# Patient Record
Sex: Male | Born: 1935 | Race: White | Hispanic: No | Marital: Married | Smoking: Former smoker
Health system: Southern US, Community
[De-identification: ages and names within clinical notes are randomized; demographics above are authoritative.]

## PROBLEM LIST (undated history)

## (undated) ENCOUNTER — Emergency Department (HOSPITAL_COMMUNITY): Discharge status: Absent without leave | Payer: Medicare Other

## (undated) DIAGNOSIS — R319 Hematuria, unspecified: Secondary | ICD-10-CM

## (undated) DIAGNOSIS — R3589 Other polyuria: Secondary | ICD-10-CM

## (undated) DIAGNOSIS — I447 Left bundle-branch block, unspecified: Secondary | ICD-10-CM

## (undated) DIAGNOSIS — R358 Other polyuria: Secondary | ICD-10-CM

## (undated) DIAGNOSIS — F329 Major depressive disorder, single episode, unspecified: Secondary | ICD-10-CM

## (undated) DIAGNOSIS — R9431 Abnormal electrocardiogram [ECG] [EKG]: Secondary | ICD-10-CM

## (undated) DIAGNOSIS — E785 Hyperlipidemia, unspecified: Secondary | ICD-10-CM

## (undated) DIAGNOSIS — F411 Generalized anxiety disorder: Secondary | ICD-10-CM

## (undated) DIAGNOSIS — R001 Bradycardia, unspecified: Secondary | ICD-10-CM

## (undated) DIAGNOSIS — F32A Depression, unspecified: Secondary | ICD-10-CM

## (undated) DIAGNOSIS — F0280 Dementia in other diseases classified elsewhere without behavioral disturbance: Secondary | ICD-10-CM

## (undated) DIAGNOSIS — G309 Alzheimer's disease, unspecified: Secondary | ICD-10-CM

## (undated) DIAGNOSIS — F0390 Unspecified dementia without behavioral disturbance: Secondary | ICD-10-CM

## (undated) DIAGNOSIS — I440 Atrioventricular block, first degree: Secondary | ICD-10-CM

## (undated) HISTORY — DX: Other polyuria: R35.89

## (undated) HISTORY — DX: Other polyuria: R35.8

## (undated) HISTORY — DX: Bradycardia, unspecified: R00.1

## (undated) HISTORY — PX: OTHER SURGICAL HISTORY: SHX169

## (undated) HISTORY — DX: Atrioventricular block, first degree: I44.0

## (undated) HISTORY — DX: Abnormal electrocardiogram (ECG) (EKG): R94.31

## (undated) HISTORY — DX: Dementia in other diseases classified elsewhere, unspecified severity, without behavioral disturbance, psychotic disturbance, mood disturbance, and anxiety: F02.80

## (undated) HISTORY — DX: Hyperlipidemia, unspecified: E78.5

## (undated) HISTORY — DX: Left bundle-branch block, unspecified: I44.7

## (undated) HISTORY — DX: Generalized anxiety disorder: F41.1

## (undated) HISTORY — DX: Hematuria, unspecified: R31.9

## (undated) HISTORY — DX: Alzheimer's disease, unspecified: G30.9

## (undated) HISTORY — DX: Unspecified dementia, unspecified severity, without behavioral disturbance, psychotic disturbance, mood disturbance, and anxiety: F03.90

---

## 2011-03-14 DIAGNOSIS — F0390 Unspecified dementia without behavioral disturbance: Secondary | ICD-10-CM | POA: Diagnosis not present

## 2011-03-14 DIAGNOSIS — I10 Essential (primary) hypertension: Secondary | ICD-10-CM | POA: Diagnosis not present

## 2011-03-14 DIAGNOSIS — G549 Nerve root and plexus disorder, unspecified: Secondary | ICD-10-CM | POA: Diagnosis not present

## 2011-03-14 DIAGNOSIS — E785 Hyperlipidemia, unspecified: Secondary | ICD-10-CM | POA: Diagnosis not present

## 2011-06-11 DIAGNOSIS — I10 Essential (primary) hypertension: Secondary | ICD-10-CM | POA: Diagnosis not present

## 2011-06-11 DIAGNOSIS — F0390 Unspecified dementia without behavioral disturbance: Secondary | ICD-10-CM | POA: Diagnosis not present

## 2011-06-11 DIAGNOSIS — G549 Nerve root and plexus disorder, unspecified: Secondary | ICD-10-CM | POA: Diagnosis not present

## 2011-06-11 DIAGNOSIS — R413 Other amnesia: Secondary | ICD-10-CM | POA: Diagnosis not present

## 2011-06-25 DIAGNOSIS — I10 Essential (primary) hypertension: Secondary | ICD-10-CM | POA: Diagnosis not present

## 2011-06-25 DIAGNOSIS — R413 Other amnesia: Secondary | ICD-10-CM | POA: Diagnosis not present

## 2011-06-25 DIAGNOSIS — E785 Hyperlipidemia, unspecified: Secondary | ICD-10-CM | POA: Diagnosis not present

## 2011-09-19 DIAGNOSIS — I10 Essential (primary) hypertension: Secondary | ICD-10-CM | POA: Diagnosis not present

## 2011-09-19 DIAGNOSIS — Z0000 Encounter for general adult medical examination without abnormal findings: Secondary | ICD-10-CM | POA: Diagnosis not present

## 2011-09-19 DIAGNOSIS — F0390 Unspecified dementia without behavioral disturbance: Secondary | ICD-10-CM | POA: Diagnosis not present

## 2011-09-19 DIAGNOSIS — Z23 Encounter for immunization: Secondary | ICD-10-CM | POA: Diagnosis not present

## 2011-09-19 DIAGNOSIS — R413 Other amnesia: Secondary | ICD-10-CM | POA: Diagnosis not present

## 2011-09-23 DIAGNOSIS — R413 Other amnesia: Secondary | ICD-10-CM | POA: Diagnosis not present

## 2011-09-23 DIAGNOSIS — I10 Essential (primary) hypertension: Secondary | ICD-10-CM | POA: Diagnosis not present

## 2011-09-23 DIAGNOSIS — M1990 Unspecified osteoarthritis, unspecified site: Secondary | ICD-10-CM | POA: Diagnosis not present

## 2011-12-19 DIAGNOSIS — E785 Hyperlipidemia, unspecified: Secondary | ICD-10-CM | POA: Diagnosis not present

## 2011-12-19 DIAGNOSIS — I10 Essential (primary) hypertension: Secondary | ICD-10-CM | POA: Diagnosis not present

## 2012-03-16 DIAGNOSIS — I6529 Occlusion and stenosis of unspecified carotid artery: Secondary | ICD-10-CM | POA: Diagnosis not present

## 2012-10-30 DIAGNOSIS — Z23 Encounter for immunization: Secondary | ICD-10-CM | POA: Diagnosis not present

## 2012-11-30 DIAGNOSIS — F0390 Unspecified dementia without behavioral disturbance: Secondary | ICD-10-CM | POA: Diagnosis not present

## 2012-11-30 DIAGNOSIS — Z7189 Other specified counseling: Secondary | ICD-10-CM | POA: Diagnosis not present

## 2012-11-30 DIAGNOSIS — N400 Enlarged prostate without lower urinary tract symptoms: Secondary | ICD-10-CM | POA: Diagnosis not present

## 2012-11-30 DIAGNOSIS — E785 Hyperlipidemia, unspecified: Secondary | ICD-10-CM | POA: Diagnosis not present

## 2013-07-01 DIAGNOSIS — B359 Dermatophytosis, unspecified: Secondary | ICD-10-CM | POA: Diagnosis not present

## 2013-09-21 DIAGNOSIS — E785 Hyperlipidemia, unspecified: Secondary | ICD-10-CM | POA: Diagnosis not present

## 2013-09-21 DIAGNOSIS — Z0000 Encounter for general adult medical examination without abnormal findings: Secondary | ICD-10-CM | POA: Diagnosis not present

## 2013-09-21 DIAGNOSIS — F329 Major depressive disorder, single episode, unspecified: Secondary | ICD-10-CM | POA: Diagnosis not present

## 2013-09-21 DIAGNOSIS — Z23 Encounter for immunization: Secondary | ICD-10-CM | POA: Diagnosis not present

## 2013-09-21 DIAGNOSIS — Z1331 Encounter for screening for depression: Secondary | ICD-10-CM | POA: Diagnosis not present

## 2013-09-21 DIAGNOSIS — F0390 Unspecified dementia without behavioral disturbance: Secondary | ICD-10-CM | POA: Diagnosis not present

## 2013-09-21 DIAGNOSIS — F3289 Other specified depressive episodes: Secondary | ICD-10-CM | POA: Diagnosis not present

## 2014-03-31 DIAGNOSIS — R319 Hematuria, unspecified: Secondary | ICD-10-CM | POA: Diagnosis not present

## 2014-03-31 DIAGNOSIS — Z7901 Long term (current) use of anticoagulants: Secondary | ICD-10-CM | POA: Diagnosis not present

## 2014-04-07 DIAGNOSIS — F0390 Unspecified dementia without behavioral disturbance: Secondary | ICD-10-CM | POA: Diagnosis not present

## 2014-05-18 DIAGNOSIS — R319 Hematuria, unspecified: Secondary | ICD-10-CM | POA: Diagnosis not present

## 2014-05-18 DIAGNOSIS — F411 Generalized anxiety disorder: Secondary | ICD-10-CM | POA: Diagnosis not present

## 2014-05-18 DIAGNOSIS — E785 Hyperlipidemia, unspecified: Secondary | ICD-10-CM | POA: Diagnosis not present

## 2014-05-18 DIAGNOSIS — F0390 Unspecified dementia without behavioral disturbance: Secondary | ICD-10-CM | POA: Diagnosis not present

## 2014-06-08 DIAGNOSIS — F0390 Unspecified dementia without behavioral disturbance: Secondary | ICD-10-CM | POA: Diagnosis not present

## 2014-06-08 DIAGNOSIS — E785 Hyperlipidemia, unspecified: Secondary | ICD-10-CM | POA: Diagnosis not present

## 2014-06-08 DIAGNOSIS — R319 Hematuria, unspecified: Secondary | ICD-10-CM | POA: Diagnosis not present

## 2014-06-08 DIAGNOSIS — F411 Generalized anxiety disorder: Secondary | ICD-10-CM | POA: Diagnosis not present

## 2014-06-08 DIAGNOSIS — E782 Mixed hyperlipidemia: Secondary | ICD-10-CM | POA: Diagnosis not present

## 2014-10-14 DIAGNOSIS — F0390 Unspecified dementia without behavioral disturbance: Secondary | ICD-10-CM | POA: Diagnosis not present

## 2014-10-14 DIAGNOSIS — Z23 Encounter for immunization: Secondary | ICD-10-CM | POA: Diagnosis not present

## 2014-10-14 DIAGNOSIS — E785 Hyperlipidemia, unspecified: Secondary | ICD-10-CM | POA: Diagnosis not present

## 2014-10-14 DIAGNOSIS — E782 Mixed hyperlipidemia: Secondary | ICD-10-CM | POA: Diagnosis not present

## 2014-10-14 DIAGNOSIS — F411 Generalized anxiety disorder: Secondary | ICD-10-CM | POA: Diagnosis not present

## 2015-01-27 DIAGNOSIS — F0390 Unspecified dementia without behavioral disturbance: Secondary | ICD-10-CM | POA: Diagnosis not present

## 2015-01-27 DIAGNOSIS — N400 Enlarged prostate without lower urinary tract symptoms: Secondary | ICD-10-CM | POA: Diagnosis not present

## 2015-01-27 DIAGNOSIS — F411 Generalized anxiety disorder: Secondary | ICD-10-CM | POA: Diagnosis not present

## 2015-01-27 DIAGNOSIS — E782 Mixed hyperlipidemia: Secondary | ICD-10-CM | POA: Diagnosis not present

## 2015-01-27 DIAGNOSIS — N390 Urinary tract infection, site not specified: Secondary | ICD-10-CM | POA: Diagnosis not present

## 2015-06-02 DIAGNOSIS — D649 Anemia, unspecified: Secondary | ICD-10-CM | POA: Diagnosis not present

## 2015-06-02 DIAGNOSIS — E785 Hyperlipidemia, unspecified: Secondary | ICD-10-CM | POA: Diagnosis not present

## 2015-06-02 DIAGNOSIS — R5383 Other fatigue: Secondary | ICD-10-CM | POA: Diagnosis not present

## 2015-06-02 DIAGNOSIS — R319 Hematuria, unspecified: Secondary | ICD-10-CM | POA: Diagnosis not present

## 2015-06-02 DIAGNOSIS — Z79899 Other long term (current) drug therapy: Secondary | ICD-10-CM | POA: Diagnosis not present

## 2015-06-06 DIAGNOSIS — R358 Other polyuria: Secondary | ICD-10-CM | POA: Diagnosis not present

## 2015-06-06 DIAGNOSIS — E782 Mixed hyperlipidemia: Secondary | ICD-10-CM | POA: Diagnosis not present

## 2015-06-06 DIAGNOSIS — F0390 Unspecified dementia without behavioral disturbance: Secondary | ICD-10-CM | POA: Diagnosis not present

## 2015-06-06 DIAGNOSIS — R5383 Other fatigue: Secondary | ICD-10-CM | POA: Diagnosis not present

## 2015-06-06 DIAGNOSIS — F411 Generalized anxiety disorder: Secondary | ICD-10-CM | POA: Diagnosis not present

## 2015-07-14 DIAGNOSIS — G309 Alzheimer's disease, unspecified: Secondary | ICD-10-CM | POA: Diagnosis not present

## 2015-07-14 DIAGNOSIS — F0280 Dementia in other diseases classified elsewhere without behavioral disturbance: Secondary | ICD-10-CM | POA: Diagnosis not present

## 2015-07-14 DIAGNOSIS — E782 Mixed hyperlipidemia: Secondary | ICD-10-CM | POA: Diagnosis not present

## 2015-07-14 DIAGNOSIS — F419 Anxiety disorder, unspecified: Secondary | ICD-10-CM | POA: Diagnosis not present

## 2015-07-19 DIAGNOSIS — F0280 Dementia in other diseases classified elsewhere without behavioral disturbance: Secondary | ICD-10-CM | POA: Diagnosis not present

## 2015-07-19 DIAGNOSIS — F419 Anxiety disorder, unspecified: Secondary | ICD-10-CM | POA: Diagnosis not present

## 2015-07-19 DIAGNOSIS — E782 Mixed hyperlipidemia: Secondary | ICD-10-CM | POA: Diagnosis not present

## 2015-07-19 DIAGNOSIS — G309 Alzheimer's disease, unspecified: Secondary | ICD-10-CM | POA: Diagnosis not present

## 2015-07-20 DIAGNOSIS — E782 Mixed hyperlipidemia: Secondary | ICD-10-CM | POA: Diagnosis not present

## 2015-07-20 DIAGNOSIS — F0280 Dementia in other diseases classified elsewhere without behavioral disturbance: Secondary | ICD-10-CM | POA: Diagnosis not present

## 2015-07-20 DIAGNOSIS — F419 Anxiety disorder, unspecified: Secondary | ICD-10-CM | POA: Diagnosis not present

## 2015-07-20 DIAGNOSIS — G309 Alzheimer's disease, unspecified: Secondary | ICD-10-CM | POA: Diagnosis not present

## 2015-07-25 DIAGNOSIS — F419 Anxiety disorder, unspecified: Secondary | ICD-10-CM | POA: Diagnosis not present

## 2015-07-25 DIAGNOSIS — G309 Alzheimer's disease, unspecified: Secondary | ICD-10-CM | POA: Diagnosis not present

## 2015-07-25 DIAGNOSIS — F0280 Dementia in other diseases classified elsewhere without behavioral disturbance: Secondary | ICD-10-CM | POA: Diagnosis not present

## 2015-07-25 DIAGNOSIS — E782 Mixed hyperlipidemia: Secondary | ICD-10-CM | POA: Diagnosis not present

## 2015-07-26 ENCOUNTER — Telehealth: Payer: Self-pay | Admitting: Cardiology

## 2015-07-26 DIAGNOSIS — G309 Alzheimer's disease, unspecified: Secondary | ICD-10-CM | POA: Diagnosis not present

## 2015-07-26 DIAGNOSIS — E782 Mixed hyperlipidemia: Secondary | ICD-10-CM | POA: Diagnosis not present

## 2015-07-26 DIAGNOSIS — R9431 Abnormal electrocardiogram [ECG] [EKG]: Secondary | ICD-10-CM | POA: Diagnosis not present

## 2015-07-26 DIAGNOSIS — R001 Bradycardia, unspecified: Secondary | ICD-10-CM | POA: Diagnosis not present

## 2015-07-26 DIAGNOSIS — F0280 Dementia in other diseases classified elsewhere without behavioral disturbance: Secondary | ICD-10-CM | POA: Diagnosis not present

## 2015-07-26 DIAGNOSIS — I447 Left bundle-branch block, unspecified: Secondary | ICD-10-CM | POA: Diagnosis not present

## 2015-07-26 DIAGNOSIS — F419 Anxiety disorder, unspecified: Secondary | ICD-10-CM | POA: Diagnosis not present

## 2015-07-27 DIAGNOSIS — E782 Mixed hyperlipidemia: Secondary | ICD-10-CM | POA: Diagnosis not present

## 2015-07-27 DIAGNOSIS — G309 Alzheimer's disease, unspecified: Secondary | ICD-10-CM | POA: Diagnosis not present

## 2015-07-27 DIAGNOSIS — F419 Anxiety disorder, unspecified: Secondary | ICD-10-CM | POA: Diagnosis not present

## 2015-07-27 DIAGNOSIS — F0280 Dementia in other diseases classified elsewhere without behavioral disturbance: Secondary | ICD-10-CM | POA: Diagnosis not present

## 2015-07-27 NOTE — Telephone Encounter (Signed)
Closed encounter °

## 2015-07-28 DIAGNOSIS — E782 Mixed hyperlipidemia: Secondary | ICD-10-CM | POA: Diagnosis not present

## 2015-07-28 DIAGNOSIS — G309 Alzheimer's disease, unspecified: Secondary | ICD-10-CM | POA: Diagnosis not present

## 2015-07-28 DIAGNOSIS — F0280 Dementia in other diseases classified elsewhere without behavioral disturbance: Secondary | ICD-10-CM | POA: Diagnosis not present

## 2015-07-28 DIAGNOSIS — F419 Anxiety disorder, unspecified: Secondary | ICD-10-CM | POA: Diagnosis not present

## 2015-08-01 DIAGNOSIS — F419 Anxiety disorder, unspecified: Secondary | ICD-10-CM | POA: Diagnosis not present

## 2015-08-01 DIAGNOSIS — G309 Alzheimer's disease, unspecified: Secondary | ICD-10-CM | POA: Diagnosis not present

## 2015-08-01 DIAGNOSIS — E782 Mixed hyperlipidemia: Secondary | ICD-10-CM | POA: Diagnosis not present

## 2015-08-01 DIAGNOSIS — F0280 Dementia in other diseases classified elsewhere without behavioral disturbance: Secondary | ICD-10-CM | POA: Diagnosis not present

## 2015-08-02 ENCOUNTER — Encounter: Payer: Self-pay | Admitting: Physician Assistant

## 2015-08-02 DIAGNOSIS — F419 Anxiety disorder, unspecified: Secondary | ICD-10-CM | POA: Diagnosis not present

## 2015-08-02 DIAGNOSIS — G309 Alzheimer's disease, unspecified: Secondary | ICD-10-CM | POA: Diagnosis not present

## 2015-08-02 DIAGNOSIS — F0280 Dementia in other diseases classified elsewhere without behavioral disturbance: Secondary | ICD-10-CM | POA: Diagnosis not present

## 2015-08-02 DIAGNOSIS — E782 Mixed hyperlipidemia: Secondary | ICD-10-CM | POA: Diagnosis not present

## 2015-08-03 ENCOUNTER — Encounter: Payer: Self-pay | Admitting: Physician Assistant

## 2015-08-03 ENCOUNTER — Ambulatory Visit (INDEPENDENT_AMBULATORY_CARE_PROVIDER_SITE_OTHER): Payer: Medicare Other | Admitting: Physician Assistant

## 2015-08-03 VITALS — BP 100/42 | HR 50 | Ht 71.0 in | Wt 184.0 lb

## 2015-08-03 DIAGNOSIS — F0280 Dementia in other diseases classified elsewhere without behavioral disturbance: Secondary | ICD-10-CM

## 2015-08-03 DIAGNOSIS — G309 Alzheimer's disease, unspecified: Secondary | ICD-10-CM | POA: Diagnosis not present

## 2015-08-03 DIAGNOSIS — I447 Left bundle-branch block, unspecified: Secondary | ICD-10-CM | POA: Diagnosis not present

## 2015-08-03 DIAGNOSIS — R001 Bradycardia, unspecified: Secondary | ICD-10-CM | POA: Diagnosis not present

## 2015-08-03 DIAGNOSIS — E785 Hyperlipidemia, unspecified: Secondary | ICD-10-CM

## 2015-08-03 LAB — BASIC METABOLIC PANEL
BUN: 21 mg/dL (ref 7–25)
CALCIUM: 9.1 mg/dL (ref 8.6–10.3)
CO2: 27 mmol/L (ref 20–31)
Chloride: 106 mmol/L (ref 98–110)
Creat: 1 mg/dL (ref 0.70–1.11)
GLUCOSE: 85 mg/dL (ref 65–99)
Potassium: 4.4 mmol/L (ref 3.5–5.3)
SODIUM: 143 mmol/L (ref 135–146)

## 2015-08-03 LAB — MAGNESIUM: Magnesium: 2.4 mg/dL (ref 1.5–2.5)

## 2015-08-03 LAB — TSH: TSH: 2.9 m[IU]/L (ref 0.40–4.50)

## 2015-08-03 NOTE — Patient Instructions (Signed)
Medication Instructions:  Your physician recommends that you continue on your current medications as directed. Please refer to the Current Medication list given to you today.   Labwork: TODAY:  TSH, MAGNESIUM, & BMET  Testing/Procedures: None ordered  Follow-Up: Your physician wants you to follow-up in: 6 MONTHS WITH DR. Katrinka BlazingSMITH.  You will receive a reminder letter in the mail two months in advance. If you don't receive a letter, please call our office to schedule the follow-up appointment.   Any Other Special Instructions Will Be Listed Below (If Applicable).     If you need a refill on your cardiac medications before your next appointment, please call your pharmacy.

## 2015-08-03 NOTE — Progress Notes (Signed)
Cardiology Office Note    Date:  08/03/2015  ID:  Troy Olp Sr., DOB 11-Jul-1922, MRN [id number] PCP:  Maryelizabeth Rowan, MD  Cardiologist:  New to Dr. Katrinka Blazing  Chief Complaint: evaluate bradycardia  History of Present Illness:  Troy Hellberg Sr. is a 82 y.o. male with history of Alzheimer's disease, habitual alcohol intake, generalized anxiety disorder, hyperlipidemia, LBBB, former tobacco abuse who presents for evaluation of bradycardia. Per PCP notes on 07/26/15: "got a call from PT yesterday, hr was 48. Increased to 52 after exercised. Wife rechecked later and was in 17s." The patient's wife was advised to keep a log of the HR which ranged 51-59 with normal BPS 120s-130s. EKG from that visit was poor technical quality, showed HR 48 with irregular rhythm - upon close inspection, there are episodic P waves present - appears possibly NSR with long first degree AVB with occasional PVC and junctional escape.   He presents to clinic today with his wife and son. His wife said his dementia has steadily been worsening. He can't really tell me who is in the room today, where he is or what season it is. He denies all symptoms including dizziness, syncope, CP, SOB. His wife affirms that he hasn't had any cardiac symptoms. He is a little "foggy" mentally in the morning but otherwise is asymptomatic. He has history of chronic alcohol abuse, but has cut down to 1-1.5 drinks daily (beer, wine) over the last two years. He is not very active. He will only walk with PT per his family.   Past Medical History  Diagnosis Date  . Generalized anxiety disorder   . Dementia   . Hyperlipidemia   . Hematuria   . Left bundle branch block   . Abnormal electrocardiogram   . Alzheimer disease   . Polyuria   . Bradycardia   . 1St degree AV block     Past Surgical History  Procedure Laterality Date  . None      Current Medications: Current Outpatient Prescriptions  Medication Sig Dispense Refill  . aspirin EC  81 MG tablet Take 81 mg by mouth daily.    Marland Kitchen escitalopram (LEXAPRO) 20 MG tablet Take 10 mg by mouth daily.    . memantine (NAMENDA) 10 MG tablet Take 10 mg by mouth 2 (two) times daily.    . tamsulosin (FLOMAX) 0.4 MG CAPS capsule Take 0.4 mg by mouth daily.     No current facility-administered medications for this visit.     Allergies:   Review of patient's allergies indicates no known allergies.   Social History   Social History  . Marital Status: Married    Spouse Name: LUETTA  . Number of Children: 8  . Years of Education: 12   Occupational History  . RETIRED    Social History Main Topics  . Smoking status: Former Smoker    Quit date: 05/10/2014  . Smokeless tobacco: None  . Alcohol Use: 0.0 oz/week    0 Standard drinks or equivalent per week     Comment: now 1.5 drinks daily  . Drug Use: No  . Sexual Activity: Not Asked   Other Topics Concern  . None   Social History Narrative     Family History:  The patient's family history includes Dementia in his mother.   ROS:   Please see the history of present illness. All other systems are reviewed and otherwise negative.    PHYSICAL EXAM:   VS:  BP 100/42 mmHg  Pulse  50  Ht 5\' 11"  (1.803 m)  Wt 184 lb (83.462 kg)  BMI 25.67 kg/m2  BMI: Body mass index is 25.67 kg/(m^2). GEN: Well nourished, well developed elderly WM, in no acute distress, somewhat frail appearing HEENT: normocephalic, atraumatic Neck: no JVD, carotid bruits, or masses Cardiac: RRR; no murmurs, rubs, or gallops, no edema  Respiratory:  clear to auscultation bilaterally, normal work of breathing GI: soft, nontender, nondistended, + BS MS: no deformity or atrophy Skin: warm and dry, no rash Neuro:  Strength and sensation are intact, follows commands, cannot tell me where he is (only with multiple choice), cannot name family members, cannot name season Psych: euthymic mood, full affect  Wt Readings from Last 3 Encounters:  08/03/15 184 lb  (83.462 kg)      Studies/Labs Reviewed:   EKG:  EKG was ordered today and personally reviewed by me and demonstrates NSR with long 1st degree AVB, LBBB 50bpm  Recent Labs: No results found for requested labs within last 365 days.   Lipid Panel No results found for: CHOL, TRIG, HDL, CHOLHDL, VLDL, LDLCALC, LDLDIRECT  Additional studies/ records that were reviewed today include: Summarized above.    ASSESSMENT & PLAN:   The patient was seen and examined by myself and Dr. Katrinka BlazingSmith and the plan formulated below per our discussion.  1. Bradycardia - EKG today shows LBBB and 1st degree AVB so he certainly has underlying conduction disease. Per Dr. Katrinka BlazingSmith, EKG from PCP's office appears possibly NSR with 1st degree AVB and episodic junctional escape and PVCs. He is not on any AVN blocking agents to stop. Fortunately he is asymptomatic from a cardiac standpoint. Given his advanced age and progressive dementia, would continue to observe conservatively for symptoms. Would not proceed with pacemaker unless we saw correlation with symptoms. This was discussed with the patient's wife and son (watch for inappropriate falling asleep, dizziness, syncope, etc). Check basic labs today to exclude underlying metabolic cause. 2. LBBB with concomitant 1st degree AV block - see above. Avoid any AV nodal blocking agents. 3. Alzheimer's disease - he is on Namenda. Would avoid ever adding Aricept as this can exacerbate AV block. 4. Hyperlipidemia - per primary care.  Disposition: F/u with Dr. Katrinka BlazingSmith in 6 months.  Medication Adjustments/Labs and Tests Ordered: Current medicines are reviewed at length with the patient today.  Concerns regarding medicines are outlined above. Medication changes, Labs and Tests ordered today are summarized above and listed in the Patient Instructions accessible in Encounters.   Thomasene MohairSigned, Keneisha Heckart PA-C  08/03/2015 3:34 PM    [facility] [address],  [place], [place]  [address] Phone: [phone]; Fax: [phone]

## 2015-08-04 ENCOUNTER — Telehealth: Payer: Self-pay | Admitting: *Deleted

## 2015-08-04 DIAGNOSIS — F0280 Dementia in other diseases classified elsewhere without behavioral disturbance: Secondary | ICD-10-CM | POA: Diagnosis not present

## 2015-08-04 DIAGNOSIS — E782 Mixed hyperlipidemia: Secondary | ICD-10-CM | POA: Diagnosis not present

## 2015-08-04 DIAGNOSIS — G309 Alzheimer's disease, unspecified: Secondary | ICD-10-CM | POA: Diagnosis not present

## 2015-08-04 DIAGNOSIS — F419 Anxiety disorder, unspecified: Secondary | ICD-10-CM | POA: Diagnosis not present

## 2015-08-04 NOTE — Telephone Encounter (Signed)
-----   Message from Laurann Montanaayna N Dunn, [place] sent at 08/04/2015  6:23 AM EDT ----- Please let patient's family know labs were normal. Ronie Spiesayna Dunn PA-C

## 2015-08-07 DIAGNOSIS — G309 Alzheimer's disease, unspecified: Secondary | ICD-10-CM | POA: Diagnosis not present

## 2015-08-07 DIAGNOSIS — F419 Anxiety disorder, unspecified: Secondary | ICD-10-CM | POA: Diagnosis not present

## 2015-08-07 DIAGNOSIS — F0280 Dementia in other diseases classified elsewhere without behavioral disturbance: Secondary | ICD-10-CM | POA: Diagnosis not present

## 2015-08-07 DIAGNOSIS — E782 Mixed hyperlipidemia: Secondary | ICD-10-CM | POA: Diagnosis not present

## 2015-08-08 DIAGNOSIS — G309 Alzheimer's disease, unspecified: Secondary | ICD-10-CM | POA: Diagnosis not present

## 2015-08-08 DIAGNOSIS — E782 Mixed hyperlipidemia: Secondary | ICD-10-CM | POA: Diagnosis not present

## 2015-08-08 DIAGNOSIS — F0280 Dementia in other diseases classified elsewhere without behavioral disturbance: Secondary | ICD-10-CM | POA: Diagnosis not present

## 2015-08-08 DIAGNOSIS — F419 Anxiety disorder, unspecified: Secondary | ICD-10-CM | POA: Diagnosis not present

## 2015-08-09 DIAGNOSIS — F419 Anxiety disorder, unspecified: Secondary | ICD-10-CM | POA: Diagnosis not present

## 2015-08-09 DIAGNOSIS — E782 Mixed hyperlipidemia: Secondary | ICD-10-CM | POA: Diagnosis not present

## 2015-08-09 DIAGNOSIS — F0280 Dementia in other diseases classified elsewhere without behavioral disturbance: Secondary | ICD-10-CM | POA: Diagnosis not present

## 2015-08-09 DIAGNOSIS — G309 Alzheimer's disease, unspecified: Secondary | ICD-10-CM | POA: Diagnosis not present

## 2015-08-10 DIAGNOSIS — E782 Mixed hyperlipidemia: Secondary | ICD-10-CM | POA: Diagnosis not present

## 2015-08-10 DIAGNOSIS — F0280 Dementia in other diseases classified elsewhere without behavioral disturbance: Secondary | ICD-10-CM | POA: Diagnosis not present

## 2015-08-10 DIAGNOSIS — F419 Anxiety disorder, unspecified: Secondary | ICD-10-CM | POA: Diagnosis not present

## 2015-08-10 DIAGNOSIS — G309 Alzheimer's disease, unspecified: Secondary | ICD-10-CM | POA: Diagnosis not present

## 2015-08-14 DIAGNOSIS — F0280 Dementia in other diseases classified elsewhere without behavioral disturbance: Secondary | ICD-10-CM | POA: Diagnosis not present

## 2015-08-14 DIAGNOSIS — F419 Anxiety disorder, unspecified: Secondary | ICD-10-CM | POA: Diagnosis not present

## 2015-08-14 DIAGNOSIS — E782 Mixed hyperlipidemia: Secondary | ICD-10-CM | POA: Diagnosis not present

## 2015-08-14 DIAGNOSIS — G309 Alzheimer's disease, unspecified: Secondary | ICD-10-CM | POA: Diagnosis not present

## 2015-08-15 DIAGNOSIS — G309 Alzheimer's disease, unspecified: Secondary | ICD-10-CM | POA: Diagnosis not present

## 2015-08-15 DIAGNOSIS — F0280 Dementia in other diseases classified elsewhere without behavioral disturbance: Secondary | ICD-10-CM | POA: Diagnosis not present

## 2015-08-15 DIAGNOSIS — E782 Mixed hyperlipidemia: Secondary | ICD-10-CM | POA: Diagnosis not present

## 2015-08-15 DIAGNOSIS — F419 Anxiety disorder, unspecified: Secondary | ICD-10-CM | POA: Diagnosis not present

## 2015-08-16 DIAGNOSIS — F419 Anxiety disorder, unspecified: Secondary | ICD-10-CM | POA: Diagnosis not present

## 2015-08-16 DIAGNOSIS — G309 Alzheimer's disease, unspecified: Secondary | ICD-10-CM | POA: Diagnosis not present

## 2015-08-16 DIAGNOSIS — F0280 Dementia in other diseases classified elsewhere without behavioral disturbance: Secondary | ICD-10-CM | POA: Diagnosis not present

## 2015-08-16 DIAGNOSIS — E782 Mixed hyperlipidemia: Secondary | ICD-10-CM | POA: Diagnosis not present

## 2015-08-17 DIAGNOSIS — F0280 Dementia in other diseases classified elsewhere without behavioral disturbance: Secondary | ICD-10-CM | POA: Diagnosis not present

## 2015-08-17 DIAGNOSIS — F419 Anxiety disorder, unspecified: Secondary | ICD-10-CM | POA: Diagnosis not present

## 2015-08-17 DIAGNOSIS — E782 Mixed hyperlipidemia: Secondary | ICD-10-CM | POA: Diagnosis not present

## 2015-08-17 DIAGNOSIS — G309 Alzheimer's disease, unspecified: Secondary | ICD-10-CM | POA: Diagnosis not present

## 2015-08-18 DIAGNOSIS — F419 Anxiety disorder, unspecified: Secondary | ICD-10-CM | POA: Diagnosis not present

## 2015-08-18 DIAGNOSIS — G309 Alzheimer's disease, unspecified: Secondary | ICD-10-CM | POA: Diagnosis not present

## 2015-08-18 DIAGNOSIS — E782 Mixed hyperlipidemia: Secondary | ICD-10-CM | POA: Diagnosis not present

## 2015-08-18 DIAGNOSIS — F0280 Dementia in other diseases classified elsewhere without behavioral disturbance: Secondary | ICD-10-CM | POA: Diagnosis not present

## 2015-08-21 DIAGNOSIS — E782 Mixed hyperlipidemia: Secondary | ICD-10-CM | POA: Diagnosis not present

## 2015-08-21 DIAGNOSIS — F419 Anxiety disorder, unspecified: Secondary | ICD-10-CM | POA: Diagnosis not present

## 2015-08-21 DIAGNOSIS — G309 Alzheimer's disease, unspecified: Secondary | ICD-10-CM | POA: Diagnosis not present

## 2015-08-21 DIAGNOSIS — F0280 Dementia in other diseases classified elsewhere without behavioral disturbance: Secondary | ICD-10-CM | POA: Diagnosis not present

## 2015-08-22 DIAGNOSIS — F419 Anxiety disorder, unspecified: Secondary | ICD-10-CM | POA: Diagnosis not present

## 2015-08-22 DIAGNOSIS — F0280 Dementia in other diseases classified elsewhere without behavioral disturbance: Secondary | ICD-10-CM | POA: Diagnosis not present

## 2015-08-22 DIAGNOSIS — G309 Alzheimer's disease, unspecified: Secondary | ICD-10-CM | POA: Diagnosis not present

## 2015-08-22 DIAGNOSIS — E782 Mixed hyperlipidemia: Secondary | ICD-10-CM | POA: Diagnosis not present

## 2015-08-23 DIAGNOSIS — G309 Alzheimer's disease, unspecified: Secondary | ICD-10-CM | POA: Diagnosis not present

## 2015-08-23 DIAGNOSIS — E782 Mixed hyperlipidemia: Secondary | ICD-10-CM | POA: Diagnosis not present

## 2015-08-23 DIAGNOSIS — F419 Anxiety disorder, unspecified: Secondary | ICD-10-CM | POA: Diagnosis not present

## 2015-08-23 DIAGNOSIS — F0280 Dementia in other diseases classified elsewhere without behavioral disturbance: Secondary | ICD-10-CM | POA: Diagnosis not present

## 2015-08-24 DIAGNOSIS — E782 Mixed hyperlipidemia: Secondary | ICD-10-CM | POA: Diagnosis not present

## 2015-08-24 DIAGNOSIS — G309 Alzheimer's disease, unspecified: Secondary | ICD-10-CM | POA: Diagnosis not present

## 2015-08-24 DIAGNOSIS — F419 Anxiety disorder, unspecified: Secondary | ICD-10-CM | POA: Diagnosis not present

## 2015-08-24 DIAGNOSIS — F0280 Dementia in other diseases classified elsewhere without behavioral disturbance: Secondary | ICD-10-CM | POA: Diagnosis not present

## 2015-08-28 DIAGNOSIS — F419 Anxiety disorder, unspecified: Secondary | ICD-10-CM | POA: Diagnosis not present

## 2015-08-28 DIAGNOSIS — G309 Alzheimer's disease, unspecified: Secondary | ICD-10-CM | POA: Diagnosis not present

## 2015-08-28 DIAGNOSIS — E782 Mixed hyperlipidemia: Secondary | ICD-10-CM | POA: Diagnosis not present

## 2015-08-28 DIAGNOSIS — F0280 Dementia in other diseases classified elsewhere without behavioral disturbance: Secondary | ICD-10-CM | POA: Diagnosis not present

## 2015-08-30 DIAGNOSIS — F419 Anxiety disorder, unspecified: Secondary | ICD-10-CM | POA: Diagnosis not present

## 2015-08-30 DIAGNOSIS — F0280 Dementia in other diseases classified elsewhere without behavioral disturbance: Secondary | ICD-10-CM | POA: Diagnosis not present

## 2015-08-30 DIAGNOSIS — E782 Mixed hyperlipidemia: Secondary | ICD-10-CM | POA: Diagnosis not present

## 2015-08-30 DIAGNOSIS — G309 Alzheimer's disease, unspecified: Secondary | ICD-10-CM | POA: Diagnosis not present

## 2015-09-04 DIAGNOSIS — F419 Anxiety disorder, unspecified: Secondary | ICD-10-CM | POA: Diagnosis not present

## 2015-09-04 DIAGNOSIS — G309 Alzheimer's disease, unspecified: Secondary | ICD-10-CM | POA: Diagnosis not present

## 2015-09-04 DIAGNOSIS — F0280 Dementia in other diseases classified elsewhere without behavioral disturbance: Secondary | ICD-10-CM | POA: Diagnosis not present

## 2015-09-04 DIAGNOSIS — E782 Mixed hyperlipidemia: Secondary | ICD-10-CM | POA: Diagnosis not present

## 2015-09-06 DIAGNOSIS — F419 Anxiety disorder, unspecified: Secondary | ICD-10-CM | POA: Diagnosis not present

## 2015-09-06 DIAGNOSIS — G309 Alzheimer's disease, unspecified: Secondary | ICD-10-CM | POA: Diagnosis not present

## 2015-09-06 DIAGNOSIS — F0280 Dementia in other diseases classified elsewhere without behavioral disturbance: Secondary | ICD-10-CM | POA: Diagnosis not present

## 2015-09-06 DIAGNOSIS — E782 Mixed hyperlipidemia: Secondary | ICD-10-CM | POA: Diagnosis not present

## 2015-09-07 DIAGNOSIS — Z0000 Encounter for general adult medical examination without abnormal findings: Secondary | ICD-10-CM | POA: Diagnosis not present

## 2015-09-07 DIAGNOSIS — Z23 Encounter for immunization: Secondary | ICD-10-CM | POA: Diagnosis not present

## 2015-09-20 ENCOUNTER — Ambulatory Visit (INDEPENDENT_AMBULATORY_CARE_PROVIDER_SITE_OTHER): Payer: Medicare Other | Admitting: Podiatry

## 2015-09-20 ENCOUNTER — Ambulatory Visit: Payer: Medicare Other | Admitting: Podiatry

## 2015-09-20 DIAGNOSIS — B351 Tinea unguium: Secondary | ICD-10-CM

## 2015-09-20 DIAGNOSIS — M79674 Pain in right toe(s): Secondary | ICD-10-CM | POA: Diagnosis not present

## 2015-09-20 DIAGNOSIS — M79675 Pain in left toe(s): Secondary | ICD-10-CM | POA: Diagnosis not present

## 2015-09-20 NOTE — Progress Notes (Signed)
   Subjective:    Patient ID: Troy OlpWilliam Seckel Sr., male    DOB: [birth year], 82 y.o.   MRN: [id number]  HPI    Review of Systems  All other systems reviewed and are negative.      Objective:   Physical Exam        Assessment & Plan:

## 2015-09-20 NOTE — Progress Notes (Signed)
Patient ID: Troy Williamson., male   DOB: [birth year], 82 y.o.   MRN: [id number] SUBJECTIVE Patient presents to office today complaining of elongated, thickened nails. Pain while ambulating in shoes. Patient is unable to trim their own nails.   No Known Allergies  OBJECTIVE General Patient is awake, alert, and oriented x 3 and in no acute distress. Derm Skin is dry and supple bilateral. Negative open lesions or macerations. Remaining integument unremarkable. Nails are tender, long, thickened and dystrophic with subungual debris, consistent with onychomycosis, 1-5 bilateral. No signs of infection noted. Vasc  DP and PT pedal pulses palpable bilaterally. Temperature gradient within normal limits.  Neuro Epicritic and protective threshold sensation diminished bilaterally.  Musculoskeletal Exam No symptomatic pedal deformities noted bilateral. Muscular strength within normal limits.  ASSESSMENT 1. Dermatophytosis of nails 1 through 5 bilateral 2. Onychomycosis of nail due to dermatophyte bilateral 3. Pain in foot bilateral  PLAN OF CARE Patient evaluated today. Instructed to maintain good pedal hygiene and foot care. Mechanical debridement of nails 1-5 bilaterally performed using a nail nipper. Filed with dremel without incident.  All patient questions were answered. Return to clinic in 3 mos.    Felecia ShellingBrent M Boniface Goffe, DPM

## 2015-09-23 NOTE — Patient Instructions (Signed)

## 2015-10-09 DIAGNOSIS — F0280 Dementia in other diseases classified elsewhere without behavioral disturbance: Secondary | ICD-10-CM | POA: Diagnosis not present

## 2015-10-09 DIAGNOSIS — I447 Left bundle-branch block, unspecified: Secondary | ICD-10-CM | POA: Diagnosis not present

## 2015-10-09 DIAGNOSIS — F411 Generalized anxiety disorder: Secondary | ICD-10-CM | POA: Diagnosis not present

## 2015-10-09 DIAGNOSIS — R2689 Other abnormalities of gait and mobility: Secondary | ICD-10-CM | POA: Diagnosis not present

## 2015-10-09 DIAGNOSIS — E785 Hyperlipidemia, unspecified: Secondary | ICD-10-CM | POA: Diagnosis not present

## 2015-10-09 DIAGNOSIS — G309 Alzheimer's disease, unspecified: Secondary | ICD-10-CM | POA: Diagnosis not present

## 2015-10-09 DIAGNOSIS — E782 Mixed hyperlipidemia: Secondary | ICD-10-CM | POA: Diagnosis not present

## 2015-10-11 DIAGNOSIS — G309 Alzheimer's disease, unspecified: Secondary | ICD-10-CM | POA: Diagnosis not present

## 2015-10-11 DIAGNOSIS — E785 Hyperlipidemia, unspecified: Secondary | ICD-10-CM | POA: Diagnosis not present

## 2015-10-11 DIAGNOSIS — F0280 Dementia in other diseases classified elsewhere without behavioral disturbance: Secondary | ICD-10-CM | POA: Diagnosis not present

## 2015-10-11 DIAGNOSIS — F411 Generalized anxiety disorder: Secondary | ICD-10-CM | POA: Diagnosis not present

## 2015-10-11 DIAGNOSIS — I447 Left bundle-branch block, unspecified: Secondary | ICD-10-CM | POA: Diagnosis not present

## 2015-10-11 DIAGNOSIS — R2689 Other abnormalities of gait and mobility: Secondary | ICD-10-CM | POA: Diagnosis not present

## 2015-10-12 DIAGNOSIS — I447 Left bundle-branch block, unspecified: Secondary | ICD-10-CM | POA: Diagnosis not present

## 2015-10-12 DIAGNOSIS — F411 Generalized anxiety disorder: Secondary | ICD-10-CM | POA: Diagnosis not present

## 2015-10-12 DIAGNOSIS — F0280 Dementia in other diseases classified elsewhere without behavioral disturbance: Secondary | ICD-10-CM | POA: Diagnosis not present

## 2015-10-12 DIAGNOSIS — G309 Alzheimer's disease, unspecified: Secondary | ICD-10-CM | POA: Diagnosis not present

## 2015-10-12 DIAGNOSIS — E785 Hyperlipidemia, unspecified: Secondary | ICD-10-CM | POA: Diagnosis not present

## 2015-10-12 DIAGNOSIS — R2689 Other abnormalities of gait and mobility: Secondary | ICD-10-CM | POA: Diagnosis not present

## 2015-10-13 DIAGNOSIS — G309 Alzheimer's disease, unspecified: Secondary | ICD-10-CM | POA: Diagnosis not present

## 2015-10-13 DIAGNOSIS — F0280 Dementia in other diseases classified elsewhere without behavioral disturbance: Secondary | ICD-10-CM | POA: Diagnosis not present

## 2015-10-13 DIAGNOSIS — F411 Generalized anxiety disorder: Secondary | ICD-10-CM | POA: Diagnosis not present

## 2015-10-13 DIAGNOSIS — R2689 Other abnormalities of gait and mobility: Secondary | ICD-10-CM | POA: Diagnosis not present

## 2015-10-13 DIAGNOSIS — E785 Hyperlipidemia, unspecified: Secondary | ICD-10-CM | POA: Diagnosis not present

## 2015-10-13 DIAGNOSIS — I447 Left bundle-branch block, unspecified: Secondary | ICD-10-CM | POA: Diagnosis not present

## 2015-10-16 DIAGNOSIS — G309 Alzheimer's disease, unspecified: Secondary | ICD-10-CM | POA: Diagnosis not present

## 2015-10-16 DIAGNOSIS — F411 Generalized anxiety disorder: Secondary | ICD-10-CM | POA: Diagnosis not present

## 2015-10-16 DIAGNOSIS — R2689 Other abnormalities of gait and mobility: Secondary | ICD-10-CM | POA: Diagnosis not present

## 2015-10-16 DIAGNOSIS — E785 Hyperlipidemia, unspecified: Secondary | ICD-10-CM | POA: Diagnosis not present

## 2015-10-16 DIAGNOSIS — I447 Left bundle-branch block, unspecified: Secondary | ICD-10-CM | POA: Diagnosis not present

## 2015-10-16 DIAGNOSIS — F0280 Dementia in other diseases classified elsewhere without behavioral disturbance: Secondary | ICD-10-CM | POA: Diagnosis not present

## 2015-10-17 DIAGNOSIS — E785 Hyperlipidemia, unspecified: Secondary | ICD-10-CM | POA: Diagnosis not present

## 2015-10-17 DIAGNOSIS — I447 Left bundle-branch block, unspecified: Secondary | ICD-10-CM | POA: Diagnosis not present

## 2015-10-17 DIAGNOSIS — F0280 Dementia in other diseases classified elsewhere without behavioral disturbance: Secondary | ICD-10-CM | POA: Diagnosis not present

## 2015-10-17 DIAGNOSIS — F411 Generalized anxiety disorder: Secondary | ICD-10-CM | POA: Diagnosis not present

## 2015-10-17 DIAGNOSIS — R2689 Other abnormalities of gait and mobility: Secondary | ICD-10-CM | POA: Diagnosis not present

## 2015-10-17 DIAGNOSIS — G309 Alzheimer's disease, unspecified: Secondary | ICD-10-CM | POA: Diagnosis not present

## 2015-10-18 DIAGNOSIS — G309 Alzheimer's disease, unspecified: Secondary | ICD-10-CM | POA: Diagnosis not present

## 2015-10-18 DIAGNOSIS — I447 Left bundle-branch block, unspecified: Secondary | ICD-10-CM | POA: Diagnosis not present

## 2015-10-18 DIAGNOSIS — R2689 Other abnormalities of gait and mobility: Secondary | ICD-10-CM | POA: Diagnosis not present

## 2015-10-18 DIAGNOSIS — E785 Hyperlipidemia, unspecified: Secondary | ICD-10-CM | POA: Diagnosis not present

## 2015-10-18 DIAGNOSIS — F411 Generalized anxiety disorder: Secondary | ICD-10-CM | POA: Diagnosis not present

## 2015-10-18 DIAGNOSIS — F0280 Dementia in other diseases classified elsewhere without behavioral disturbance: Secondary | ICD-10-CM | POA: Diagnosis not present

## 2015-10-20 DIAGNOSIS — F411 Generalized anxiety disorder: Secondary | ICD-10-CM | POA: Diagnosis not present

## 2015-10-20 DIAGNOSIS — G309 Alzheimer's disease, unspecified: Secondary | ICD-10-CM | POA: Diagnosis not present

## 2015-10-20 DIAGNOSIS — F0280 Dementia in other diseases classified elsewhere without behavioral disturbance: Secondary | ICD-10-CM | POA: Diagnosis not present

## 2015-10-20 DIAGNOSIS — I447 Left bundle-branch block, unspecified: Secondary | ICD-10-CM | POA: Diagnosis not present

## 2015-10-20 DIAGNOSIS — E785 Hyperlipidemia, unspecified: Secondary | ICD-10-CM | POA: Diagnosis not present

## 2015-10-20 DIAGNOSIS — R2689 Other abnormalities of gait and mobility: Secondary | ICD-10-CM | POA: Diagnosis not present

## 2015-10-23 DIAGNOSIS — F0280 Dementia in other diseases classified elsewhere without behavioral disturbance: Secondary | ICD-10-CM | POA: Diagnosis not present

## 2015-10-23 DIAGNOSIS — I447 Left bundle-branch block, unspecified: Secondary | ICD-10-CM | POA: Diagnosis not present

## 2015-10-23 DIAGNOSIS — G309 Alzheimer's disease, unspecified: Secondary | ICD-10-CM | POA: Diagnosis not present

## 2015-10-23 DIAGNOSIS — R2689 Other abnormalities of gait and mobility: Secondary | ICD-10-CM | POA: Diagnosis not present

## 2015-10-23 DIAGNOSIS — F411 Generalized anxiety disorder: Secondary | ICD-10-CM | POA: Diagnosis not present

## 2015-10-23 DIAGNOSIS — E785 Hyperlipidemia, unspecified: Secondary | ICD-10-CM | POA: Diagnosis not present

## 2015-10-24 DIAGNOSIS — I447 Left bundle-branch block, unspecified: Secondary | ICD-10-CM | POA: Diagnosis not present

## 2015-10-24 DIAGNOSIS — R2689 Other abnormalities of gait and mobility: Secondary | ICD-10-CM | POA: Diagnosis not present

## 2015-10-24 DIAGNOSIS — F0280 Dementia in other diseases classified elsewhere without behavioral disturbance: Secondary | ICD-10-CM | POA: Diagnosis not present

## 2015-10-24 DIAGNOSIS — E785 Hyperlipidemia, unspecified: Secondary | ICD-10-CM | POA: Diagnosis not present

## 2015-10-24 DIAGNOSIS — G309 Alzheimer's disease, unspecified: Secondary | ICD-10-CM | POA: Diagnosis not present

## 2015-10-24 DIAGNOSIS — F411 Generalized anxiety disorder: Secondary | ICD-10-CM | POA: Diagnosis not present

## 2015-10-26 DIAGNOSIS — G309 Alzheimer's disease, unspecified: Secondary | ICD-10-CM | POA: Diagnosis not present

## 2015-10-26 DIAGNOSIS — F0280 Dementia in other diseases classified elsewhere without behavioral disturbance: Secondary | ICD-10-CM | POA: Diagnosis not present

## 2015-10-26 DIAGNOSIS — I447 Left bundle-branch block, unspecified: Secondary | ICD-10-CM | POA: Diagnosis not present

## 2015-10-26 DIAGNOSIS — R2689 Other abnormalities of gait and mobility: Secondary | ICD-10-CM | POA: Diagnosis not present

## 2015-10-26 DIAGNOSIS — F411 Generalized anxiety disorder: Secondary | ICD-10-CM | POA: Diagnosis not present

## 2015-10-26 DIAGNOSIS — E785 Hyperlipidemia, unspecified: Secondary | ICD-10-CM | POA: Diagnosis not present

## 2015-10-27 DIAGNOSIS — F0280 Dementia in other diseases classified elsewhere without behavioral disturbance: Secondary | ICD-10-CM | POA: Diagnosis not present

## 2015-10-27 DIAGNOSIS — I447 Left bundle-branch block, unspecified: Secondary | ICD-10-CM | POA: Diagnosis not present

## 2015-10-27 DIAGNOSIS — R2689 Other abnormalities of gait and mobility: Secondary | ICD-10-CM | POA: Diagnosis not present

## 2015-10-27 DIAGNOSIS — G309 Alzheimer's disease, unspecified: Secondary | ICD-10-CM | POA: Diagnosis not present

## 2015-10-27 DIAGNOSIS — F411 Generalized anxiety disorder: Secondary | ICD-10-CM | POA: Diagnosis not present

## 2015-10-27 DIAGNOSIS — E785 Hyperlipidemia, unspecified: Secondary | ICD-10-CM | POA: Diagnosis not present

## 2015-10-31 DIAGNOSIS — G309 Alzheimer's disease, unspecified: Secondary | ICD-10-CM | POA: Diagnosis not present

## 2015-10-31 DIAGNOSIS — I447 Left bundle-branch block, unspecified: Secondary | ICD-10-CM | POA: Diagnosis not present

## 2015-10-31 DIAGNOSIS — F0280 Dementia in other diseases classified elsewhere without behavioral disturbance: Secondary | ICD-10-CM | POA: Diagnosis not present

## 2015-10-31 DIAGNOSIS — E785 Hyperlipidemia, unspecified: Secondary | ICD-10-CM | POA: Diagnosis not present

## 2015-10-31 DIAGNOSIS — R2689 Other abnormalities of gait and mobility: Secondary | ICD-10-CM | POA: Diagnosis not present

## 2015-10-31 DIAGNOSIS — F411 Generalized anxiety disorder: Secondary | ICD-10-CM | POA: Diagnosis not present

## 2015-11-01 DIAGNOSIS — G309 Alzheimer's disease, unspecified: Secondary | ICD-10-CM | POA: Diagnosis not present

## 2015-11-01 DIAGNOSIS — R2689 Other abnormalities of gait and mobility: Secondary | ICD-10-CM | POA: Diagnosis not present

## 2015-11-01 DIAGNOSIS — I447 Left bundle-branch block, unspecified: Secondary | ICD-10-CM | POA: Diagnosis not present

## 2015-11-01 DIAGNOSIS — F411 Generalized anxiety disorder: Secondary | ICD-10-CM | POA: Diagnosis not present

## 2015-11-01 DIAGNOSIS — E785 Hyperlipidemia, unspecified: Secondary | ICD-10-CM | POA: Diagnosis not present

## 2015-11-01 DIAGNOSIS — F0280 Dementia in other diseases classified elsewhere without behavioral disturbance: Secondary | ICD-10-CM | POA: Diagnosis not present

## 2015-11-03 DIAGNOSIS — E785 Hyperlipidemia, unspecified: Secondary | ICD-10-CM | POA: Diagnosis not present

## 2015-11-03 DIAGNOSIS — F411 Generalized anxiety disorder: Secondary | ICD-10-CM | POA: Diagnosis not present

## 2015-11-03 DIAGNOSIS — I447 Left bundle-branch block, unspecified: Secondary | ICD-10-CM | POA: Diagnosis not present

## 2015-11-03 DIAGNOSIS — G309 Alzheimer's disease, unspecified: Secondary | ICD-10-CM | POA: Diagnosis not present

## 2015-11-03 DIAGNOSIS — R2689 Other abnormalities of gait and mobility: Secondary | ICD-10-CM | POA: Diagnosis not present

## 2015-11-03 DIAGNOSIS — F0280 Dementia in other diseases classified elsewhere without behavioral disturbance: Secondary | ICD-10-CM | POA: Diagnosis not present

## 2015-11-07 DIAGNOSIS — G309 Alzheimer's disease, unspecified: Secondary | ICD-10-CM | POA: Diagnosis not present

## 2015-11-07 DIAGNOSIS — F0280 Dementia in other diseases classified elsewhere without behavioral disturbance: Secondary | ICD-10-CM | POA: Diagnosis not present

## 2015-11-07 DIAGNOSIS — E785 Hyperlipidemia, unspecified: Secondary | ICD-10-CM | POA: Diagnosis not present

## 2015-11-07 DIAGNOSIS — F411 Generalized anxiety disorder: Secondary | ICD-10-CM | POA: Diagnosis not present

## 2015-11-07 DIAGNOSIS — R2689 Other abnormalities of gait and mobility: Secondary | ICD-10-CM | POA: Diagnosis not present

## 2015-11-07 DIAGNOSIS — I447 Left bundle-branch block, unspecified: Secondary | ICD-10-CM | POA: Diagnosis not present

## 2015-11-14 DIAGNOSIS — E785 Hyperlipidemia, unspecified: Secondary | ICD-10-CM | POA: Diagnosis not present

## 2015-11-14 DIAGNOSIS — F0280 Dementia in other diseases classified elsewhere without behavioral disturbance: Secondary | ICD-10-CM | POA: Diagnosis not present

## 2015-11-14 DIAGNOSIS — G309 Alzheimer's disease, unspecified: Secondary | ICD-10-CM | POA: Diagnosis not present

## 2015-11-14 DIAGNOSIS — R2689 Other abnormalities of gait and mobility: Secondary | ICD-10-CM | POA: Diagnosis not present

## 2015-11-14 DIAGNOSIS — F411 Generalized anxiety disorder: Secondary | ICD-10-CM | POA: Diagnosis not present

## 2015-11-14 DIAGNOSIS — I447 Left bundle-branch block, unspecified: Secondary | ICD-10-CM | POA: Diagnosis not present

## 2015-11-21 DIAGNOSIS — F411 Generalized anxiety disorder: Secondary | ICD-10-CM | POA: Diagnosis not present

## 2015-11-21 DIAGNOSIS — F0280 Dementia in other diseases classified elsewhere without behavioral disturbance: Secondary | ICD-10-CM | POA: Diagnosis not present

## 2015-11-21 DIAGNOSIS — I447 Left bundle-branch block, unspecified: Secondary | ICD-10-CM | POA: Diagnosis not present

## 2015-11-21 DIAGNOSIS — E785 Hyperlipidemia, unspecified: Secondary | ICD-10-CM | POA: Diagnosis not present

## 2015-11-21 DIAGNOSIS — G309 Alzheimer's disease, unspecified: Secondary | ICD-10-CM | POA: Diagnosis not present

## 2015-11-21 DIAGNOSIS — R2689 Other abnormalities of gait and mobility: Secondary | ICD-10-CM | POA: Diagnosis not present

## 2015-11-28 DIAGNOSIS — E860 Dehydration: Secondary | ICD-10-CM | POA: Diagnosis not present

## 2015-11-28 DIAGNOSIS — I447 Left bundle-branch block, unspecified: Secondary | ICD-10-CM | POA: Diagnosis not present

## 2015-11-28 DIAGNOSIS — R2689 Other abnormalities of gait and mobility: Secondary | ICD-10-CM | POA: Diagnosis not present

## 2015-11-28 DIAGNOSIS — F411 Generalized anxiety disorder: Secondary | ICD-10-CM | POA: Diagnosis not present

## 2015-11-28 DIAGNOSIS — E785 Hyperlipidemia, unspecified: Secondary | ICD-10-CM | POA: Diagnosis not present

## 2015-11-28 DIAGNOSIS — G309 Alzheimer's disease, unspecified: Secondary | ICD-10-CM | POA: Diagnosis not present

## 2015-11-28 DIAGNOSIS — F0280 Dementia in other diseases classified elsewhere without behavioral disturbance: Secondary | ICD-10-CM | POA: Diagnosis not present

## 2015-11-30 DIAGNOSIS — N390 Urinary tract infection, site not specified: Secondary | ICD-10-CM | POA: Diagnosis not present

## 2015-11-30 DIAGNOSIS — E860 Dehydration: Secondary | ICD-10-CM | POA: Diagnosis not present

## 2015-11-30 DIAGNOSIS — Z6825 Body mass index (BMI) 25.0-25.9, adult: Secondary | ICD-10-CM | POA: Diagnosis not present

## 2015-11-30 DIAGNOSIS — R358 Other polyuria: Secondary | ICD-10-CM | POA: Diagnosis not present

## 2015-11-30 DIAGNOSIS — G309 Alzheimer's disease, unspecified: Secondary | ICD-10-CM | POA: Diagnosis not present

## 2015-11-30 DIAGNOSIS — F0280 Dementia in other diseases classified elsewhere without behavioral disturbance: Secondary | ICD-10-CM | POA: Diagnosis not present

## 2015-12-03 DIAGNOSIS — E785 Hyperlipidemia, unspecified: Secondary | ICD-10-CM | POA: Diagnosis not present

## 2015-12-03 DIAGNOSIS — G309 Alzheimer's disease, unspecified: Secondary | ICD-10-CM | POA: Diagnosis not present

## 2015-12-03 DIAGNOSIS — F411 Generalized anxiety disorder: Secondary | ICD-10-CM | POA: Diagnosis not present

## 2015-12-03 DIAGNOSIS — I447 Left bundle-branch block, unspecified: Secondary | ICD-10-CM | POA: Diagnosis not present

## 2015-12-03 DIAGNOSIS — F0280 Dementia in other diseases classified elsewhere without behavioral disturbance: Secondary | ICD-10-CM | POA: Diagnosis not present

## 2015-12-03 DIAGNOSIS — R2689 Other abnormalities of gait and mobility: Secondary | ICD-10-CM | POA: Diagnosis not present

## 2016-02-07 ENCOUNTER — Ambulatory Visit: Payer: Medicare Other | Admitting: Podiatry

## 2016-02-20 ENCOUNTER — Ambulatory Visit: Payer: Medicare Other | Admitting: Interventional Cardiology

## 2016-02-23 ENCOUNTER — Ambulatory Visit: Payer: Medicare Other | Admitting: Interventional Cardiology

## 2016-02-29 ENCOUNTER — Emergency Department (HOSPITAL_COMMUNITY): Payer: Medicare Other

## 2016-02-29 ENCOUNTER — Encounter (HOSPITAL_COMMUNITY): Payer: Self-pay | Admitting: Emergency Medicine

## 2016-02-29 ENCOUNTER — Emergency Department (HOSPITAL_COMMUNITY)
Admission: EM | Admit: 2016-02-29 | Discharge: 2016-02-29 | Disposition: A | Discharge status: Home / Self Care | Payer: Medicare Other | Attending: Emergency Medicine | Admitting: Emergency Medicine

## 2016-02-29 DIAGNOSIS — Z87891 Personal history of nicotine dependence: Secondary | ICD-10-CM | POA: Insufficient documentation

## 2016-02-29 DIAGNOSIS — R5381 Other malaise: Secondary | ICD-10-CM | POA: Diagnosis not present

## 2016-02-29 DIAGNOSIS — Z7982 Long term (current) use of aspirin: Secondary | ICD-10-CM | POA: Diagnosis not present

## 2016-02-29 DIAGNOSIS — R404 Transient alteration of awareness: Secondary | ICD-10-CM | POA: Diagnosis not present

## 2016-02-29 DIAGNOSIS — R531 Weakness: Secondary | ICD-10-CM | POA: Diagnosis not present

## 2016-02-29 DIAGNOSIS — G309 Alzheimer's disease, unspecified: Secondary | ICD-10-CM | POA: Diagnosis not present

## 2016-02-29 DIAGNOSIS — R4182 Altered mental status, unspecified: Secondary | ICD-10-CM | POA: Diagnosis not present

## 2016-02-29 HISTORY — DX: Depression, unspecified: F32.A

## 2016-02-29 HISTORY — DX: Major depressive disorder, single episode, unspecified: F32.9

## 2016-02-29 LAB — URINALYSIS, ROUTINE W REFLEX MICROSCOPIC
BILIRUBIN URINE: NEGATIVE
Bacteria, UA: NONE SEEN
GLUCOSE, UA: NEGATIVE mg/dL
KETONES UR: NEGATIVE mg/dL
LEUKOCYTES UA: NEGATIVE
Nitrite: NEGATIVE
PH: 5 (ref 5.0–8.0)
PROTEIN: NEGATIVE mg/dL
SQUAMOUS EPITHELIAL / LPF: NONE SEEN
Specific Gravity, Urine: 1.02 (ref 1.005–1.030)

## 2016-02-29 LAB — I-STAT CG4 LACTIC ACID, ED
LACTIC ACID, VENOUS: 0.89 mmol/L (ref 0.5–1.9)
Lactic Acid, Venous: 1.1 mmol/L (ref 0.5–1.9)

## 2016-02-29 LAB — COMPREHENSIVE METABOLIC PANEL
ALT: 25 U/L (ref 17–63)
AST: 53 U/L — AB (ref 15–41)
Albumin: 3.2 g/dL — ABNORMAL LOW (ref 3.5–5.0)
Alkaline Phosphatase: 62 U/L (ref 38–126)
Anion gap: 8 (ref 5–15)
BUN: 20 mg/dL (ref 6–20)
CHLORIDE: 105 mmol/L (ref 101–111)
CO2: 28 mmol/L (ref 22–32)
CREATININE: 1.08 mg/dL (ref 0.61–1.24)
Calcium: 8.7 mg/dL — ABNORMAL LOW (ref 8.9–10.3)
GFR calc Af Amer: >60 mL/min (ref >60)
GFR calc non Af Amer: 58 mL/min — ABNORMAL LOW (ref >60)
Glucose, Bld: 91 mg/dL (ref 65–99)
POTASSIUM: 4.3 mmol/L (ref 3.5–5.1)
SODIUM: 141 mmol/L (ref 135–145)
Total Bilirubin: 1.2 mg/dL (ref 0.3–1.2)
Total Protein: 6 g/dL — ABNORMAL LOW (ref 6.5–8.1)

## 2016-02-29 LAB — CBC
HEMATOCRIT: 36.8 % — AB (ref 39.0–52.0)
Hemoglobin: 11.9 g/dL — ABNORMAL LOW (ref 13.0–17.0)
MCH: 31.2 pg (ref 26.0–34.0)
MCHC: 32.3 g/dL (ref 30.0–36.0)
MCV: 96.6 fL (ref 78.0–100.0)
PLATELETS: 203 10*3/uL (ref 150–400)
RBC: 3.81 MIL/uL — AB (ref 4.22–5.81)
RDW: 14.4 % (ref 11.5–15.5)
WBC: 5.7 10*3/uL (ref 4.0–10.5)

## 2016-02-29 LAB — I-STAT TROPONIN, ED: Troponin i, poc: 0.02 ng/mL (ref 0.00–0.08)

## 2016-02-29 LAB — CBG MONITORING, ED: Glucose-Capillary: 79 mg/dL (ref 65–99)

## 2016-02-29 MED ORDER — SODIUM CHLORIDE 0.9 % IV BOLUS (SEPSIS)
1000.0000 mL | Freq: Once | INTRAVENOUS | Status: AC
  Administered 2016-02-29: 1000 mL via INTRAVENOUS

## 2016-02-29 NOTE — ED Notes (Signed)
Per Earlie Louina Tech, patient ambulated quite well but family is still concerned about taking patient home and being able to care for him appropriately. EDP made aware.

## 2016-02-29 NOTE — ED Notes (Signed)
Coude at bedside for EDP

## 2016-02-29 NOTE — ED Notes (Signed)
Walked patient down the [place] patient did well

## 2016-02-29 NOTE — ED Provider Notes (Signed)
[facility]-EMERGENCY DEPT Provider Note   CSN: [id number] Arrival date & time: 02/29/16  1114     History   Chief Complaint Chief Complaint  Patient presents with  . Altered Mental Status    HPI Troy Barrales Sr. is a 83 y.o. male.  He presents with family members for evaluation of decreased ability to ambulate, eat, and mentate. Symptoms gradually onset over the last 48 hours. He can typically walk with his walker, and assist in his activities of daily living. No reported fever, chills, vomiting or cough. He cannot give any history. His son and wife are here with him and give the history.  Level V caveat- altered mental status  HPI  Past Medical History:  Diagnosis Date  . 1st degree AV block   . Abnormal electrocardiogram   . Alzheimer disease   . Bradycardia   . Dementia   . Depression   . Generalized anxiety disorder   . Hematuria   . Hyperlipidemia   . Left bundle branch block   . Polyuria     There are no active problems to display for this patient.   Past Surgical History:  Procedure Laterality Date  . NONE         Home Medications    Prior to Admission medications   Medication Sig Start Date End Date Taking? Authorizing Provider  aspirin EC 81 MG tablet Take 81 mg by mouth daily.    Historical Provider, MD  escitalopram (LEXAPRO) 20 MG tablet Take 10 mg by mouth daily.    Historical Provider, MD  memantine (NAMENDA) 10 MG tablet Take 10 mg by mouth 2 (two) times daily.    Historical Provider, MD  tamsulosin (FLOMAX) 0.4 MG CAPS capsule Take 0.4 mg by mouth daily.    Historical Provider, MD    Family History Family History  Problem Relation Age of Onset  . Dementia Mother   . Alcoholism    . High Cholesterol      Social History Social History  Substance Use Topics  . Smoking status: Former Smoker    Quit date: 05/10/2014  . Smokeless tobacco: Never Used  . Alcohol use 0.0 oz/week     Comment: now 1.5 drinks daily     Allergies   Patient  has no known allergies.   Review of Systems Review of Systems  Unable to perform ROS: Mental status change     Physical Exam Updated Vital Signs BP (!) 127/52 (BP Location: Right Arm)   Pulse (!) 56   Temp 98 F (36.7 C) (Oral)   Resp 18   SpO2 100%   Physical Exam  Constitutional: He appears well-developed.  Elderly, frail  HENT:  Head: Normocephalic and atraumatic.  Right Ear: External ear normal.  Left Ear: External ear normal.  Dry oral mucous membranes  Eyes: Conjunctivae are normal. Pupils are equal, round, and reactive to light.  Neck: Normal range of motion and phonation normal. Neck supple.  Cardiovascular: Normal rate, regular rhythm and normal heart sounds.   Pulmonary/Chest: Effort normal and breath sounds normal. No respiratory distress. He has no wheezes. He exhibits no bony tenderness.  Abdominal: Soft. There is no tenderness.  Musculoskeletal: Normal range of motion.  Neurological: He exhibits normal muscle tone.  Nonverbal. Follow simple command to open mouth. No gross facial asymmetry.  Skin: Skin is warm, dry and intact.  Psychiatric:  Obtunded  Nursing note and vitals reviewed.    ED Treatments / Results  Labs (all labs  ordered are listed, but only abnormal results are displayed) Labs Reviewed  COMPREHENSIVE METABOLIC PANEL - Abnormal; Notable for the following:       Result Value   Calcium 8.7 (*)    Total Protein 6.0 (*)    Albumin 3.2 (*)    AST 53 (*)    GFR calc non Af Amer 58 (*)    All other components within normal limits  CBC - Abnormal; Notable for the following:    RBC 3.81 (*)    Hemoglobin 11.9 (*)    HCT 36.8 (*)    All other components within normal limits  URINALYSIS, ROUTINE W REFLEX MICROSCOPIC  CBG MONITORING, ED  I-STAT TROPOININ, ED  I-STAT CG4 LACTIC ACID, ED    EKG  EKG Interpretation  Date/Time:  Thursday February 29 2016 11:14:40 EST Ventricular Rate:  53 PR Interval:    QRS Duration: 152 QT  Interval:  476 QTC Calculation: 447 R Axis:   71 Text Interpretation:  Atrial fibrillation IVCD, consider atypical LBBB No old tracing to compare Confirmed by [facility]  MD, Tonika Eden [id number]) on 02/29/2016 2:11:29 PM       This patients CHA2DS2-VASc Score and unadjusted Ischemic Stroke Rate (% per year) is equal to 2.2 % stroke rate/year from a score of 2  Above score calculated as 1 point each if present [CHF, HTN, DM, Vascular=MI/PAD/Aortic Plaque, Age if 65-74, or Male] Above score calculated as 2 points each if present [Age > 75, or Stroke/TIA/TE]   Radiology No results found.  Procedures BLADDER CATHETERIZATION Date/Time: 02/29/2016 5:42 PM Performed by: Mancel Bale Authorized by: Mancel Bale   Pre-procedure details:    Procedure purpose:  Diagnostic   Preparation: Patient was prepped and draped in usual sterile fashion   Anesthesia (see MAR for exact dosages):    Anesthesia method:  None Procedure details:    Provider performed due to:  Complicated insertion   Catheter type:  Coude and Foley   Catheter size:  22 Fr   Bladder irrigation: no     Number of attempts:  1   Urine characteristics:  Clear Post-procedure details:    Patient tolerance of procedure:  Tolerated well, no immediate complications    (including critical care time)  Medications Ordered in ED Medications  sodium chloride 0.9 % bolus 1,000 mL (not administered)     Initial Impression / Assessment and Plan / ED Course  I have reviewed the triage vital signs and the nursing notes.  Pertinent labs & imaging results that were available during my care of the patient were reviewed by me and considered in my medical decision making (see chart for details).  Clinical Course as of Mar 01 1735  Thu Feb 29, 2016  1659 The patient was able to ambulate, per usual with his walker according to family members.  He has been able to eat and drink here apparently, per his usual.  Family members now state that  the patient had in-home evaluation by home health, to arrange for additional care.  They are in the process of getting that going in today they were going to see the PCP to arrange for a document stating that the patient needed assistance at home because he was homebound.  Case management consult requested for here to help proceed with the in-home health assistance.  [EW]    Clinical Course User Index [EW] Mancel Bale, MD       Medications  sodium chloride 0.9 % bolus 1,000 mL (  not administered)    Patient Vitals for the past 24 hrs:  BP Temp Temp src Pulse Resp SpO2  02/29/16 1119 (!) 127/52 98 F (36.7 C) Oral (!) 56 18 100 %  02/29/16 1116 (!) 127/52 98 F (36.7 C) Oral (!) 54 20 100 %       Final Clinical Impressions(s) / ED Diagnoses   Final diagnoses:  Malaise   Patient with gradually worsening debility, improved after IV fluids in the emergency department.  He recovered his ability to eat and ambulate, does not have any acute medical abnormalities.  Doubt serious bacterial infection metabolic instability or impending vascular collapse.  Patient is being in the process of getting more home health services.  Case manager asked to help with that process, the patient has already been referred to [facility].   Nursing Notes Reviewed/ Care Coordinated Applicable Imaging Reviewed Interpretation of Laboratory Data incorporated into ED treatment  The patient appears reasonably screened and/or stabilized for discharge and I doubt any other medical condition or other [facility] requiring further screening, evaluation, or treatment in the ED at this time prior to discharge.  Plan: Home Medications- continue; Home Treatments-rest, increase oral fluid intake; return here if the recommended treatment, does not improve the symptoms; Recommended follow up-PCP follow-up 1 week and as needed    New Prescriptions New Prescriptions   No medications on file     Mancel BaleElliott Aking Klabunde,  MD 03/01/16 [phone]

## 2016-02-29 NOTE — ED Notes (Signed)
Checked patient cbg it was 4079 notifed RN Stacy of blood sugar

## 2016-02-29 NOTE — Care Management Note (Signed)
Case Management Note  Patient Details  Name: Troy Slaymaker Sr. MRN: [id number] Date of Birth: 11/28/1922  Subjective/Objective:         Patient presents to the ED with decrease ability to ambulate and AMS           Action/Plan: CM met with patient and spouse at bedside to discuss recommendations of [facility] services.CM explained that there were no acute finding upon ED evaluation. Spouse agreeable to care transition plan [facility] services. Offered choice [facility] selected. Referral faxed to [facility] via [place]. Spouse states that she will be getting assistance soon from the [place] with daily [place]. Updated Dr. Lita Mains, Patient will be transported home via private vehicle accompanied by family. No further ED CM needs identified at this time.   Expected Discharge Date:       02/29/16           Expected Discharge Plan:  [place]  In-House Referral:     Discharge planning Services  CM Consult  Post Acute Care Choice:  Home Health Choice offered to:  Patient  DME Arranged:    DME Agency:     HH Arranged:  RN, PT, Nurse's Aide [place] Agency:  [place]  Status of Service:  Completed, signed off  If discussed at [place] of Stay Meetings, dates discussed:    Additional CommentsLaurena Slimmer, RN 02/29/2016, 11:32 PM

## 2016-02-29 NOTE — ED Notes (Signed)
When attempting in and out cath, catheter unable to go in due to some sort of stricter that caused bleeding.  EDP made aware. Pt is incontinent and in and out cath unsuccessful at producing urine.

## 2016-02-29 NOTE — ED Notes (Signed)
Pt on way to CT and XR.

## 2016-02-29 NOTE — ED Triage Notes (Signed)
Per EMS, patient's wife states that patient began going down hill x 3 weeks ago.  Patient walks with a walker but began having difficulty ambulating yesterday. No medical history other than Depression and dementia.   Began having incontinent spells recently as well.  This morning patient was unable to get out of bed and could not move legs properly.  Follows commands but not answering questions as quickly as he used to.  Grip strength weak but no neuro deficits noted by EMS.  Brady at 48-60, LBB and first degree heart block noted on EKG en route.  CBG 110, 116/49, RR 20, 99-100% RA with clear lung sounds.

## 2016-02-29 NOTE — ED Notes (Signed)
Gave patient [place] sandwich per Dr Effie ShyWentz patient is eating and drinking

## 2016-03-01 LAB — URINE CULTURE: CULTURE: NO GROWTH

## 2016-03-28 DIAGNOSIS — R358 Other polyuria: Secondary | ICD-10-CM | POA: Diagnosis not present

## 2016-03-28 DIAGNOSIS — F411 Generalized anxiety disorder: Secondary | ICD-10-CM | POA: Diagnosis not present

## 2016-03-28 DIAGNOSIS — F0390 Unspecified dementia without behavioral disturbance: Secondary | ICD-10-CM | POA: Diagnosis not present

## 2016-03-28 DIAGNOSIS — Z6825 Body mass index (BMI) 25.0-25.9, adult: Secondary | ICD-10-CM | POA: Diagnosis not present

## 2016-04-04 ENCOUNTER — Ambulatory Visit: Payer: Medicare Other | Admitting: Podiatrist

## 2016-04-29 ENCOUNTER — Ambulatory Visit (INDEPENDENT_AMBULATORY_CARE_PROVIDER_SITE_OTHER): Payer: Medicare Other | Admitting: Podiatry

## 2016-04-29 DIAGNOSIS — M79671 Pain in right foot: Secondary | ICD-10-CM | POA: Diagnosis not present

## 2016-04-29 DIAGNOSIS — L84 Corns and callosities: Secondary | ICD-10-CM

## 2016-04-29 DIAGNOSIS — M79672 Pain in left foot: Secondary | ICD-10-CM

## 2016-04-29 DIAGNOSIS — L851 Acquired keratosis [keratoderma] palmaris et plantaris: Secondary | ICD-10-CM | POA: Diagnosis not present

## 2016-04-30 DIAGNOSIS — R358 Other polyuria: Secondary | ICD-10-CM | POA: Diagnosis not present

## 2016-04-30 DIAGNOSIS — G309 Alzheimer's disease, unspecified: Secondary | ICD-10-CM | POA: Diagnosis not present

## 2016-04-30 DIAGNOSIS — Z9181 History of falling: Secondary | ICD-10-CM | POA: Diagnosis not present

## 2016-04-30 DIAGNOSIS — F0390 Unspecified dementia without behavioral disturbance: Secondary | ICD-10-CM | POA: Diagnosis not present

## 2016-04-30 DIAGNOSIS — F0280 Dementia in other diseases classified elsewhere without behavioral disturbance: Secondary | ICD-10-CM | POA: Diagnosis not present

## 2016-05-01 NOTE — Progress Notes (Signed)
   Subjective: Patient presents to the office today for chief complaint of painful callus lesion to the right third toe. Patient states that the pain is ongoing and is affecting their ability to ambulate without pain. Patient presents today for further treatment and evaluation.  Objective:  Physical Exam General: Alert and oriented x3 in no acute distress  Dermatology: Hyperkeratotic lesion present on the right third toe. Pain on palpation with a central nucleated core noted.  Skin is warm, dry and supple bilateral lower extremities. Negative for open lesions or macerations.  Vascular: Palpable pedal pulses bilaterally. No edema or erythema noted. Capillary refill within normal limits.  Neurological: Epicritic and protective threshold grossly intact bilaterally.   Musculoskeletal Exam: Pain on palpation at the keratotic lesion noted. Range of motion within normal limits bilateral. Muscle strength 5/5 in all groups bilateral.  Assessment: #1 corn to the right third toe distal tuft   Plan of Care:  #1 Patient evaluated #2 Excisional debridement of keratoic lesion using a chisel blade was performed without incident.  #3 Treated area(s) with Salinocaine and dressed with light dressing. #4 Patient is to return to the clinic PRN.   Felecia Shelling, DPM [facility]  Dr. Felecia Shelling, DPM    [address]                                        [place], [place] [address]                Office [phone]  Fax [phone]

## 2016-05-12 DIAGNOSIS — Z9181 History of falling: Secondary | ICD-10-CM | POA: Diagnosis not present

## 2016-05-12 DIAGNOSIS — E785 Hyperlipidemia, unspecified: Secondary | ICD-10-CM | POA: Diagnosis not present

## 2016-05-12 DIAGNOSIS — N400 Enlarged prostate without lower urinary tract symptoms: Secondary | ICD-10-CM | POA: Diagnosis not present

## 2016-05-12 DIAGNOSIS — I447 Left bundle-branch block, unspecified: Secondary | ICD-10-CM | POA: Diagnosis not present

## 2016-05-12 DIAGNOSIS — F0281 Dementia in other diseases classified elsewhere with behavioral disturbance: Secondary | ICD-10-CM | POA: Diagnosis not present

## 2016-05-12 DIAGNOSIS — I440 Atrioventricular block, first degree: Secondary | ICD-10-CM | POA: Diagnosis not present

## 2016-05-12 DIAGNOSIS — R001 Bradycardia, unspecified: Secondary | ICD-10-CM | POA: Diagnosis not present

## 2016-05-12 DIAGNOSIS — G309 Alzheimer's disease, unspecified: Secondary | ICD-10-CM | POA: Diagnosis not present

## 2016-05-14 DIAGNOSIS — I440 Atrioventricular block, first degree: Secondary | ICD-10-CM | POA: Diagnosis not present

## 2016-05-14 DIAGNOSIS — G309 Alzheimer's disease, unspecified: Secondary | ICD-10-CM | POA: Diagnosis not present

## 2016-05-14 DIAGNOSIS — I447 Left bundle-branch block, unspecified: Secondary | ICD-10-CM | POA: Diagnosis not present

## 2016-05-14 DIAGNOSIS — F0281 Dementia in other diseases classified elsewhere with behavioral disturbance: Secondary | ICD-10-CM | POA: Diagnosis not present

## 2016-05-14 DIAGNOSIS — E785 Hyperlipidemia, unspecified: Secondary | ICD-10-CM | POA: Diagnosis not present

## 2016-05-14 DIAGNOSIS — R001 Bradycardia, unspecified: Secondary | ICD-10-CM | POA: Diagnosis not present

## 2016-05-15 ENCOUNTER — Emergency Department (HOSPITAL_COMMUNITY): Payer: Medicare Other

## 2016-05-15 ENCOUNTER — Encounter (HOSPITAL_COMMUNITY): Payer: Self-pay | Admitting: *Deleted

## 2016-05-15 ENCOUNTER — Inpatient Hospital Stay (HOSPITAL_COMMUNITY)
Admission: EM | Admit: 2016-05-15 | Discharge: 2016-05-19 | DRG: 308 | Disposition: A | Discharge status: Hospice | Payer: Medicare Other | Attending: Internal Medicine | Admitting: Internal Medicine

## 2016-05-15 DIAGNOSIS — Z87891 Personal history of nicotine dependence: Secondary | ICD-10-CM

## 2016-05-15 DIAGNOSIS — R030 Elevated blood-pressure reading, without diagnosis of hypertension: Secondary | ICD-10-CM | POA: Diagnosis present

## 2016-05-15 DIAGNOSIS — I447 Left bundle-branch block, unspecified: Secondary | ICD-10-CM | POA: Diagnosis present

## 2016-05-15 DIAGNOSIS — R358 Other polyuria: Secondary | ICD-10-CM | POA: Diagnosis not present

## 2016-05-15 DIAGNOSIS — G20 Parkinson's disease: Secondary | ICD-10-CM | POA: Diagnosis not present

## 2016-05-15 DIAGNOSIS — Z7982 Long term (current) use of aspirin: Secondary | ICD-10-CM

## 2016-05-15 DIAGNOSIS — G309 Alzheimer's disease, unspecified: Secondary | ICD-10-CM | POA: Diagnosis not present

## 2016-05-15 DIAGNOSIS — G9340 Encephalopathy, unspecified: Secondary | ICD-10-CM | POA: Diagnosis present

## 2016-05-15 DIAGNOSIS — F323 Major depressive disorder, single episode, severe with psychotic features: Secondary | ICD-10-CM | POA: Diagnosis not present

## 2016-05-15 DIAGNOSIS — F329 Major depressive disorder, single episode, unspecified: Secondary | ICD-10-CM | POA: Diagnosis present

## 2016-05-15 DIAGNOSIS — R001 Bradycardia, unspecified: Secondary | ICD-10-CM | POA: Diagnosis not present

## 2016-05-15 DIAGNOSIS — F05 Delirium due to known physiological condition: Secondary | ICD-10-CM | POA: Diagnosis present

## 2016-05-15 DIAGNOSIS — R410 Disorientation, unspecified: Secondary | ICD-10-CM | POA: Diagnosis not present

## 2016-05-15 DIAGNOSIS — R011 Cardiac murmur, unspecified: Secondary | ICD-10-CM | POA: Diagnosis present

## 2016-05-15 DIAGNOSIS — F0280 Dementia in other diseases classified elsewhere without behavioral disturbance: Secondary | ICD-10-CM

## 2016-05-15 DIAGNOSIS — Z66 Do not resuscitate: Secondary | ICD-10-CM

## 2016-05-15 DIAGNOSIS — R627 Adult failure to thrive: Secondary | ICD-10-CM | POA: Diagnosis present

## 2016-05-15 DIAGNOSIS — I350 Nonrheumatic aortic (valve) stenosis: Secondary | ICD-10-CM | POA: Diagnosis not present

## 2016-05-15 DIAGNOSIS — F0390 Unspecified dementia without behavioral disturbance: Secondary | ICD-10-CM | POA: Diagnosis not present

## 2016-05-15 DIAGNOSIS — R4182 Altered mental status, unspecified: Secondary | ICD-10-CM | POA: Diagnosis not present

## 2016-05-15 DIAGNOSIS — I441 Atrioventricular block, second degree: Secondary | ICD-10-CM | POA: Diagnosis not present

## 2016-05-15 DIAGNOSIS — Z79899 Other long term (current) drug therapy: Secondary | ICD-10-CM | POA: Diagnosis not present

## 2016-05-15 DIAGNOSIS — I440 Atrioventricular block, first degree: Principal | ICD-10-CM | POA: Diagnosis present

## 2016-05-15 DIAGNOSIS — R531 Weakness: Secondary | ICD-10-CM | POA: Diagnosis not present

## 2016-05-15 DIAGNOSIS — Z515 Encounter for palliative care: Secondary | ICD-10-CM | POA: Diagnosis present

## 2016-05-15 DIAGNOSIS — R402421 Glasgow coma scale score 9-12, in the field [EMT or ambulance]: Secondary | ICD-10-CM | POA: Diagnosis not present

## 2016-05-15 DIAGNOSIS — E785 Hyperlipidemia, unspecified: Secondary | ICD-10-CM | POA: Diagnosis not present

## 2016-05-15 DIAGNOSIS — I5043 Acute on chronic combined systolic (congestive) and diastolic (congestive) heart failure: Secondary | ICD-10-CM | POA: Diagnosis present

## 2016-05-15 DIAGNOSIS — F411 Generalized anxiety disorder: Secondary | ICD-10-CM | POA: Diagnosis present

## 2016-05-15 DIAGNOSIS — W19XXXA Unspecified fall, initial encounter: Secondary | ICD-10-CM | POA: Diagnosis present

## 2016-05-15 DIAGNOSIS — R5383 Other fatigue: Secondary | ICD-10-CM | POA: Diagnosis not present

## 2016-05-15 DIAGNOSIS — I5031 Acute diastolic (congestive) heart failure: Secondary | ICD-10-CM | POA: Diagnosis not present

## 2016-05-15 DIAGNOSIS — R319 Hematuria, unspecified: Secondary | ICD-10-CM | POA: Diagnosis not present

## 2016-05-15 DIAGNOSIS — F0281 Dementia in other diseases classified elsewhere with behavioral disturbance: Secondary | ICD-10-CM | POA: Diagnosis not present

## 2016-05-15 DIAGNOSIS — N400 Enlarged prostate without lower urinary tract symptoms: Secondary | ICD-10-CM | POA: Diagnosis present

## 2016-05-15 DIAGNOSIS — F32A Depression, unspecified: Secondary | ICD-10-CM | POA: Diagnosis present

## 2016-05-15 LAB — CBC WITH DIFFERENTIAL/PLATELET
BASOS PCT: 0 %
Basophils Absolute: 0 10*3/uL (ref 0.0–0.1)
EOS ABS: 0.1 10*3/uL (ref 0.0–0.7)
Eosinophils Relative: 1 %
HCT: 33 % — ABNORMAL LOW (ref 39.0–52.0)
Hemoglobin: 10.8 g/dL — ABNORMAL LOW (ref 13.0–17.0)
Lymphocytes Relative: 27 %
Lymphs Abs: 1.7 10*3/uL (ref 0.7–4.0)
MCH: 31.8 pg (ref 26.0–34.0)
MCHC: 32.7 g/dL (ref 30.0–36.0)
MCV: 97.1 fL (ref 78.0–100.0)
MONOS PCT: 7 %
Monocytes Absolute: 0.4 10*3/uL (ref 0.1–1.0)
Neutro Abs: 4.1 10*3/uL (ref 1.7–7.7)
Neutrophils Relative %: 65 %
Platelets: 272 10*3/uL (ref 150–400)
RBC: 3.4 MIL/uL — ABNORMAL LOW (ref 4.22–5.81)
RDW: 14.7 % (ref 11.5–15.5)
WBC: 6.3 10*3/uL (ref 4.0–10.5)

## 2016-05-15 LAB — BASIC METABOLIC PANEL
Anion gap: 5 (ref 5–15)
BUN: 19 mg/dL (ref 6–20)
CO2: 28 mmol/L (ref 22–32)
CREATININE: 1.01 mg/dL (ref 0.61–1.24)
Calcium: 8.8 mg/dL — ABNORMAL LOW (ref 8.9–10.3)
Chloride: 109 mmol/L (ref 101–111)
GFR calc non Af Amer: >60 mL/min (ref >60)
Glucose, Bld: 99 mg/dL (ref 65–99)
Potassium: 3.9 mmol/L (ref 3.5–5.1)
SODIUM: 142 mmol/L (ref 135–145)

## 2016-05-15 LAB — URINALYSIS, ROUTINE W REFLEX MICROSCOPIC
BACTERIA UA: NONE SEEN
BILIRUBIN URINE: NEGATIVE
Glucose, UA: NEGATIVE mg/dL
KETONES UR: NEGATIVE mg/dL
LEUKOCYTES UA: NEGATIVE
NITRITE: NEGATIVE
PROTEIN: NEGATIVE mg/dL
SPECIFIC GRAVITY, URINE: 1.014 (ref 1.005–1.030)
SQUAMOUS EPITHELIAL / LPF: NONE SEEN
pH: 6 (ref 5.0–8.0)

## 2016-05-15 LAB — I-STAT TROPONIN, ED: TROPONIN I, POC: 0.01 ng/mL (ref 0.00–0.08)

## 2016-05-15 LAB — MAGNESIUM: Magnesium: 2.2 mg/dL (ref 1.7–2.4)

## 2016-05-15 MED ORDER — ONDANSETRON HCL 4 MG PO TABS: 4.0000 mg | ORAL_TABLET | Freq: Four times a day (QID) | ORAL | Status: DC | PRN

## 2016-05-15 MED ORDER — ASPIRIN 300 MG RE SUPP
150.0000 mg | Freq: Every day | RECTAL | Status: DC
  Administered 2016-05-16: 150 mg via RECTAL
  Filled 2016-05-15 (×2): qty 1

## 2016-05-15 MED ORDER — ACETAMINOPHEN 325 MG PO TABS
650.0000 mg | ORAL_TABLET | Freq: Four times a day (QID) | ORAL | Status: DC | PRN
  Administered 2016-05-18: 650 mg via ORAL
  Filled 2016-05-15: qty 2

## 2016-05-15 MED ORDER — FUROSEMIDE 10 MG/ML IJ SOLN
20.0000 mg | Freq: Every day | INTRAMUSCULAR | Status: DC
  Administered 2016-05-16: 20 mg via INTRAVENOUS
  Filled 2016-05-15: qty 2

## 2016-05-15 MED ORDER — ENOXAPARIN SODIUM 40 MG/0.4ML ~~LOC~~ SOLN
40.0000 mg | Freq: Every day | SUBCUTANEOUS | Status: DC
  Administered 2016-05-16: 40 mg via SUBCUTANEOUS
  Filled 2016-05-15: qty 0.4

## 2016-05-15 MED ORDER — ACETAMINOPHEN 650 MG RE SUPP: 650.0000 mg | Freq: Four times a day (QID) | RECTAL | Status: DC | PRN

## 2016-05-15 MED ORDER — ONDANSETRON HCL 4 MG/2ML IJ SOLN: 4.0000 mg | Freq: Four times a day (QID) | INTRAMUSCULAR | Status: DC | PRN

## 2016-05-15 NOTE — ED Notes (Addendum)
Patient presents to ed via [facility] per ems family states patient has been altered for the last several days. Up ems arrival found patient to ed in a slow bradycardia rate 30 however when he was aroused his heart rate would increase to 50-55. Patient has a history of dementia and denies having any pain. Cbg per ems was 109

## 2016-05-15 NOTE — ED Notes (Signed)
Pt waiting for another physician

## 2016-05-15 NOTE — ED Triage Notes (Signed)
Dr. Kennedy Bucker nurse phones to tell us pt. Will be coming to hospital d/t lethargy and HTN and Dr. Duanne Guess recommends a Palliative Care consult.

## 2016-05-15 NOTE — ED Provider Notes (Signed)
I received this patient in signout from Dr. Fayrene Fearing. We were awaiting cardiology evaluation. Patient evaluated by Dr. Ladona Ridgel who determined that he is not a pacemaker candidate because of his multiple comorbidities and advanced age. Family willing to discuss hospice care. Case manager Burna Mortimer contacted who discussed with family. He is unable to perform ADLs at home, unable to stand and this may be in part due to his symptomatic bradycardia. Therefore, the patient will have to be placed at a facility hospice. Discussed admission with [facility] hospitalist, Dr. Clyde Lundborg, appreciate assistance. PT admitted for further care including palliative care consult.   Laurence Spates, MD 05/15/16 [phone]

## 2016-05-15 NOTE — ED Notes (Signed)
Pt restless he wants to get out of the bed

## 2016-05-15 NOTE — ED Notes (Signed)
[place] alarm setup for pt

## 2016-05-15 NOTE — Care Management (Addendum)
ED CM received consult from Dr. Jeneen Rinks concerning recommendation for home hospice  HPOG was choice. Amy liaison from [facility] notified but patient's family was requesting to discuss options for treatment plan for 1st degree heart block. with Cardiologist before making a decision on hospice services..  Cardiologist met with family to discuss prognosis and explained that patient would not be a candidate for pacemaker.  The  recommendations was for hospice services with comfort care.  Palliative Care team consulted. CM met with patient spouse and son Troy Williamson  [phone] at bedside to discuss goals of care. Spouse states, she is not able to provide the level of care that patient needs at home,  he has had several recent falls, and she is not ablle to  get him up when he falls. According to wife he is weak, and having difficulty standing or ambulating. Discussed goals of care, residential hospice services  is what the family has agreed upon.   Updated Dr. Rex Kras, and Laruth Bouchard RN.   ED CM placed referral to  HPOG  and CSW consult for Residential Hospice, CSW will follow up on residential hospice referral in am.

## 2016-05-15 NOTE — ED Notes (Signed)
Family at bedside. 

## 2016-05-15 NOTE — Progress Notes (Signed)
[facility] Liaison:  RN  Received text from [facility] regarding patient/family interest in hospice care.  After speaking with her, she advised to hold off at this point, not a referral at this time.    Thank you,  Adele Barthel, RN, BSN [facility] Liaison [phone]  All hospital liaison's are now on AMION.

## 2016-05-15 NOTE — H&P (Signed)
History and Physical    Troy Hallenbeck Sr. ZOX:[id number] DOB: 11-Oct-1922 DOA: 05/15/2016  Referring MD/NP/PA:   PCP: Maryelizabeth Rowan, MD   Patient coming from:  The patient is coming from home.  At baseline, pt is partially dependent for most of ADL.      Chief Complaint: Altered mental status, bradycardia  HPI: Troy Leh Sr. is a 83 y.o. male with medical history significant of left bundle blockage, dementia, bradycardia, first-degree AV block, hyperlipidemia, depression, BPH, who presents with altered mental status and bradycardia.  I spoke with the patient is on the phone. Per his son, patient has generalized weakness in the past several days. The patient has whole body weakness, no unilateral weakness most likely per his son. Patient had fall 2 weeks ago on his walker, most likely did not have any injury per his son. Today pt was found to be confused by home health nurse. Per his son, patient does not seem to have chest pain, difficulty breathing, nausea, vomiting and diarrhea. He moves all extremities. Per report, patient was found to have severe bradycardia with heart rate down to 25-30s. EKG in ED showed sinus rhythm with first degree AVB, marked bradycardia, PAC's and PVC's.   ED Course: pt was found to have negative troponin, negative urinalysis, creatinine 1.01, temperature normal, O2 sat 96% on room air, negative chest x-ray. Pt is admitted to med-surg bed as inpt. Cardiology, Dr. Ladona Ridgel was consulted.  # CT head is negative for acute intracranial abnormalities, but showed stable atrophy with patchy periventricular small vessel disease. There are foci of arterial vascular calcification bilaterally. There is paranasal sinus disease, primarily in several ethmoid air cells. There is leftward deviation of the nasal septum  Review of Systems: Could not be reviewed with due to altered mental status.  Allergy: No Known Allergies  Past Medical History:  Diagnosis Date  . 1st degree AV  block   . Abnormal electrocardiogram   . Alzheimer disease   . Bradycardia   . Dementia   . Depression   . Generalized anxiety disorder   . Hematuria   . Hyperlipidemia   . Left bundle branch block   . Polyuria     Past Surgical History:  Procedure Laterality Date  . NONE      Social History:  reports that he quit smoking about 2 years ago. He has never used smokeless tobacco. He reports that he drinks alcohol. He reports that he does not use drugs.  Family History:  Family History  Problem Relation Age of Onset  . Dementia Mother   . Alcoholism    . High Cholesterol       Prior to Admission medications   Medication Sig Start Date End Date Taking? Authorizing Provider  aspirin EC 81 MG tablet Take 81 mg by mouth daily.   Yes Historical Provider, MD  Cranberry 125 MG TABS Take 125 mg by mouth every morning.   Yes Historical Provider, MD  escitalopram (LEXAPRO) 20 MG tablet Take 20 mg by mouth daily.    Yes Historical Provider, MD  finasteride (PROSCAR) 5 MG tablet Take 5 mg by mouth every evening. 03/28/16  Yes Historical Provider, MD  memantine (NAMENDA) 10 MG tablet Take 10 mg by mouth 2 (two) times daily.   Yes Historical Provider, MD  oxybutynin (DITROPAN) 5 MG tablet Take 5 mg by mouth 2 (two) times daily. 04/30/16  Yes Historical Provider, MD  polyethylene glycol powder (GLYCOLAX/MIRALAX) powder Take 8.5-17 g by mouth at bedtime  as needed for mild constipation.    Yes Historical Provider, MD    Physical Exam: Vitals:   05/15/16 2230 05/15/16 2300 05/16/16 0000 05/16/16 0121  BP: (!) 179/63 (!) 172/62 (!) 161/71 (!) 169/64  Pulse:    (!) 57  Resp: (!) 37 (!) 26 16 (!) 24  Temp:    98 F (36.7 C)  TempSrc:    Oral  SpO2:    100%  Weight:    82.5 kg (181 lb 14.1 oz)   General: Not in acute distress HEENT:       Eyes: PERRL, EOMI, no scleral icterus.       ENT: No discharge from the ears and nose, no pharynx injection, no tonsillar enlargement.        Neck: No  JVD, no bruit, no mass felt. Heme: No neck lymph node enlargement. Cardiac: S1/S2, bradycardia, irregular rhythm, No murmurs, No gallops or rubs. Respiratory: No rales, wheezing, rhonchi or rubs. GI: Soft, nondistended, nontender, no rebound pain, no organomegaly, BS present. GU: No hematuria Ext: has 1+ pitting leg edema bilaterally. 2+DP/PT pulse bilaterally. Musculoskeletal: No joint deformities, No joint redness or warmth, no limitation of ROM in spin. Skin: No rashes.  Neuro: confused, not oriented X3, cranial nerves II-XII grossly intact, moves all extremities. Psych: Patient is not psychotic.  Labs on Admission: I have personally reviewed following labs and imaging studies  CBC:  Recent Labs Lab 05/15/16 1318  WBC 6.3  NEUTROABS 4.1  HGB 10.8*  HCT 33.0*  MCV 97.1  PLT 272   Basic Metabolic Panel:  Recent Labs Lab 05/15/16 1318  NA 142  K 3.9  CL 109  CO2 28  GLUCOSE 99  BUN 19  CREATININE 1.01  CALCIUM 8.8*  MG 2.2   GFR: CrCl cannot be calculated (Unknown ideal weight.). Liver Function Tests: No results for input(s): AST, ALT, ALKPHOS, BILITOT, PROT, ALBUMIN in the last 168 hours. No results for input(s): LIPASE, AMYLASE in the last 168 hours. No results for input(s): AMMONIA in the last 168 hours. Coagulation Profile: No results for input(s): INR, PROTIME in the last 168 hours. Cardiac Enzymes:  Recent Labs Lab 05/16/16 0007  TROPONINI <0.03   BNP (last 3 results) No results for input(s): PROBNP in the last 8760 hours. HbA1C: No results for input(s): HGBA1C in the last 72 hours. CBG: No results for input(s): GLUCAP in the last 168 hours. Lipid Profile: No results for input(s): CHOL, HDL, LDLCALC, TRIG, CHOLHDL, LDLDIRECT in the last 72 hours. Thyroid Function Tests: No results for input(s): TSH, T4TOTAL, FREET4, T3FREE, THYROIDAB in the last 72 hours. Anemia Panel: No results for input(s): VITAMINB12, FOLATE, FERRITIN, TIBC, IRON,  RETICCTPCT in the last 72 hours. Urine analysis:    Component Value Date/Time   COLORURINE YELLOW 05/15/2016 1436   APPEARANCEUR CLEAR 05/15/2016 1436   LABSPEC 1.014 05/15/2016 1436   PHURINE 6.0 05/15/2016 1436   GLUCOSEU NEGATIVE 05/15/2016 1436   HGBUR LARGE (A) 05/15/2016 1436   BILIRUBINUR NEGATIVE 05/15/2016 1436   KETONESUR NEGATIVE 05/15/2016 1436   PROTEINUR NEGATIVE 05/15/2016 1436   NITRITE NEGATIVE 05/15/2016 1436   LEUKOCYTESUR NEGATIVE 05/15/2016 1436   Sepsis Labs: (procalcitonin:4,lacticidven:4) )No results found for this or any previous visit (from the past 240 hour(s)).   Radiological Exams on Admission: Ct Head Wo Contrast  Result Date: 05/15/2016 CLINICAL DATA:  Lethargy with altered mental status for several days. Confusion EXAM: CT HEAD WITHOUT CONTRAST TECHNIQUE: Contiguous axial images were obtained from the  base of the skull through the vertex without intravenous contrast. COMPARISON:  February 29, 2016 FINDINGS: Brain: Moderate generalized atrophy is stable. There is no intracranial mass, hemorrhage, extra-axial fluid collection, or midline shift. There is patchy small vessel disease in the centra semiovale bilaterally. Elsewhere gray-white compartments appear normal. There is no evident acute infarct. Vascular: There is no hyperdense vessel. There are foci of calcification in the carotid siphon regions bilaterally. Skull: The bony calvarium appears intact. There is residua of a previous small left occipital scalp hematoma, stable from prior study. Sinuses/Orbits: There is mucosal thickening in several ethmoid air cells bilaterally. There is slight mucosal thickening along the anterior wall of the right sphenoid sinus. Other visualized paranasal sinuses are clear. There is leftward deviation of the nasal septum. Visualized orbits appear unremarkable bilaterally. Other: Mastoid air cells are clear. IMPRESSION: Stable atrophy with patchy periventricular small  vessel disease. No intracranial mass, hemorrhage, or extra-axial fluid collection. No acute infarct. There are foci of arterial vascular calcification bilaterally. There is paranasal sinus disease, primarily in several ethmoid air cells. There is leftward deviation of the nasal septum. Electronically Signed   By: Bretta Bang III M.D.   On: 05/15/2016 13:53   Dg Chest Port 1 View  Result Date: 05/15/2016 CLINICAL DATA:  83 year old male with altered mental status for 2 days, weakness, confusion, bradycardia. EXAM: PORTABLE CHEST 1 VIEW COMPARISON:  02/29/2016. FINDINGS: Portable AP semi upright view at 1250 hours. Pacer or resuscitation pads project over the left chest. Mildly lower lung volumes. Stable cardiac size and mediastinal contours. Calcified aortic atherosclerosis. Visualized tracheal air column is within normal limits. Allowing for portable technique the lungs are clear. No pneumothorax or pleural effusion identified. IMPRESSION: No acute cardiopulmonary abnormality.  Mildly lower lung volumes. Calcified aortic atherosclerosis. Electronically Signed   By: Odessa Fleming M.D.   On: 05/15/2016 13:09     EKG: Independently reviewed.  QTC 332, sinus rhythm with first degree AVB, marked bradycardia, PAC's and PVC's, anteroseptal infarction pattern, LBBB   Assessment/Plan Principal Problem:   Bradycardia Active Problems:   Hyperlipidemia   Depression   Alzheimer disease   Acute encephalopathy   Elevated blood pressure reading   Bradycardia: EKG showed sinus rhythm with first degree AVB, marked bradycardia, PAC's, PVC's, anteroseptal infarction pattern and LBBB. Cardiology, Dr. Ladona Ridgel was consulted--> Pt is not a candidate for any invasive procedures. Dr. Ladona Ridgel recommended hospice care, start Lasix due to possible diastolic congestive heart failure and 2d echo. Dr. Ladona Ridgel also suggested not placing the patient on telemetry, I discussed with his son on the phone, who agreed with Dr. Lubertha Basque  recommendation, will not put patient on telemetry bed.  -will admit to med-surg bed as inpt -Highly appreciate cardiologist, dr. Lubertha Basque recommendations -consult hospice care, palliative care, social worker, case manager -check BNP, TSH, Free T4. -2d echo -start lasix 20 mg daily-->will switch to oral when mental status improves -trop x 3 -ASA per rectal  HLD: Last LDL was not on record. Pt is not taking medications at home. -Check FLP  Depression: -hold oral meds due to AMS  Alzheimer disease:  -hold oral meds due to AMS  Acute encephalopathy: Etiology is not clear. No sense of infection. CT head is negative for acute intracranial abnormalities. Possibly due to severe bradycardia. -Frequent neurologic checks  Elevated blood pressure reading: bp 168/72. Pt does not have history of hypertension, not taking medications for blood pressure. -IV hydralazine when necessary  DVT ppx: SQ Lovenox Code Status:  DNR (I discussed with patient's son on the phone, and explained the meaning of CODE STATUS. Per her son, patient would want to be DNR) Family Communication: yes, patient's son on the phone Disposition Plan:  Anticipate discharge to SNF or hospice care facility Consults called:  Cardiology, Dr. Ladona Ridgel Admission status: medical floor/inpt       Date of Service 05/16/2016    Lorretta Harp [facility] Pager [phone]  If 7PM-7AM, please contact night-coverage [email or web address] Password [email or web address] 05/16/2016, 3:03 AM

## 2016-05-15 NOTE — ED Notes (Signed)
Admitting doctor at the bedside 

## 2016-05-15 NOTE — Consult Note (Signed)
CARDIOLOGY CONSULT NOTE   Patient ID: Troy Olp Sr. MRN: [id number] DOB/AGE: 81/17/1924 81 y.o.  Admit date: 05/15/2016  Requesting Physician: Dr. Rolland Porter (ER) Primary Physician:   Maryelizabeth Rowan, MD Primary Cardiologist:   Dr. Jens Som (2017) Reason for Consultation:   Evaluation for pacemaker (bradycardia)  HPI: Troy Erman Sr. is a 83 y.o. male who is being seen today in consultation at the request of Dr. Rolland Porter for bradycardia.  The patient has as PMH of moderate dementia, 1st degree AV block, habitual alcohol intake, bradycardia, generalized anxiety, hyperlipidemia, left bundle branch block. He came to the ER after a fall last night with ongoing weakness. He has been declining these past two weeks and failing to thrive. At this ER visit it has been determined that he will be a DNR/DNI. His heart rates have been typically in the 50's but during his ER stay they have noticed heart rates between the 20-30's. The patients family are interested in possible pacemaker. The plan is to have electrophysiology weigh in on if the patient is a good candidate for a pacemaker, if he is not, he will likely be a hospice patient and go home. If he is a candidate for pacemaker then he will go to the palliative care service-- this is per Dr. Fayrene Fearing.   At home the patient has been eating poorly, confused at baseline, the wife feels that this has been his baseline. EKG in the ER shows sinus rhythm with first degree AVB, marked bradycardia, PAC's and PVC's.   The patient has a history of bradycardia with first degree AV block. He saw Ronie Spies, PA-C  in the office 08/03/2015 for his bradycardia The plan at this time was per Dr. Katrinka Blazing, that given his advanced age and progressive dementia he would continue to observe as he was asymptomatic. Symptoms to watch out for were inappropriate falling asleep, dizziness, syncope. No metabolic abnormalities to be causing the bradycardia. He is not on any AV  nodal blocking agents. -- It was found in the primary care doctors notes on routine vital sign check.  Negative Troponin, Na 142, K 3.9, Cr 1.01, magnesium 2.2, Hgb 10.8, platelets 272, neg UA, unremarkable Head CT and chest xray.      Past Medical History:  Diagnosis Date  . 1st degree AV block   . Abnormal electrocardiogram   . Alzheimer disease   . Bradycardia   . Dementia   . Depression   . Generalized anxiety disorder   . Hematuria   . Hyperlipidemia   . Left bundle branch block   . Polyuria      Past Surgical History:  Procedure Laterality Date  . NONE      No Known Allergies  I have reviewed the patient's current medications     Prior to Admission medications   Medication Sig Start Date End Date Taking? Authorizing Provider  aspirin EC 81 MG tablet Take 81 mg by mouth daily.    Historical Provider, MD  escitalopram (LEXAPRO) 20 MG tablet Take 10 mg by mouth daily.    Historical Provider, MD  memantine (NAMENDA) 10 MG tablet Take 10 mg by mouth 2 (two) times daily.    Historical Provider, MD  tamsulosin (FLOMAX) 0.4 MG CAPS capsule Take 0.4 mg by mouth daily.    Historical Provider, MD     Social History   Social History  . Marital status: Married    Spouse name: LUETTA  . Number of  children: 8  . Years of education: 47   Occupational History  . RETIRED    Social History Main Topics  . Smoking status: Former Smoker    Quit date: 05/10/2014  . Smokeless tobacco: Never Used  . Alcohol use 0.0 oz/week     Comment: now 1.5 drinks daily  . Drug use: No  . Sexual activity: Not on file   Other Topics Concern  . Not on file   Social History Narrative  . No narrative on file    Family Status  Relation Status  . Mother Deceased  . Father Deceased  . Sister Deceased  . Sister Alive  . Brother Deceased   LUNG CANCER  . Brother Deceased   ACCIDENT  . Brother Alive  .    Marland Kitchen     Family History  Problem Relation Age of Onset  . Dementia Mother     . Alcoholism    . High Cholesterol          ROS:  Level 5, dementia  Physical Exam: HENT:  Head: Normocephalic.  Eyes: Conjunctivae are normal. Pupils are equal, round, and reactive to light. No scleral icterus.  Neck: Normal range of motion. Neck supple. No thyromegaly present.  Cardiovascular: Exam reveals no gallop and no friction rub.   No murmur heard. Sinus bradycardia. 20's -50s  Pulmonary/Chest: Effort normal and breath sounds normal. No respiratory distress. He has no wheezes. He has no rales.  Abdominal: Soft. Bowel sounds are normal. He exhibits no distension. There is no tenderness. There is no rebound.  Musculoskeletal: Normal range of motion.  Neurological: He is alert.  Skin: Skin is warm and dry. No rash noted.  Psychiatric: He has a normal mood and affect. His behavior is normal.      Labs:   Lab Results  Component Value Date   WBC 6.3 05/15/2016   HGB 10.8 (L) 05/15/2016   HCT 33.0 (L) 05/15/2016   MCV 97.1 05/15/2016   PLT 272 05/15/2016   No results for input(s): INR in the last 72 hours.   Recent Labs Lab 05/15/16 1318  NA 142  K 3.9  CL 109  CO2 28  BUN 19  CREATININE 1.01  CALCIUM 8.8*  GLUCOSE 99   Magnesium  Date Value Ref Range Status  05/15/2016 2.2 1.7 - 2.4 mg/dL Final   No results for input(s): CKTOTAL, CKMB, TROPONINI in the last 72 hours.  Recent Labs  05/15/16 1323  TROPIPOC 0.01   No results found for: PROBNP No results found for: CHOL, HDL, LDLCALC, TRIG No results found for: DDIMER No results found for: LIPASE, AMYLASE TSH  Date/Time Value Ref Range Status  08/03/2015 03:50 PM 2.90 0.40 - 4.50 mIU/L Final    Echo   No Recent  ECG:      HR 68, sinus rhythm with first degree AVB, marked bradycardia, PAC's and PVC's. -personally reviewed.    Radiology:Ct Head Wo Contrast  Result Date: 05/15/2016 CLINICAL DATA:  Lethargy with altered mental status for several days. Confusion EXAM: CT HEAD WITHOUT  CONTRAST TECHNIQUE: Contiguous axial images were obtained from the base of the skull through the vertex without intravenous contrast. COMPARISON:  February 29, 2016 FINDINGS: Brain: Moderate generalized atrophy is stable. There is no intracranial mass, hemorrhage, extra-axial fluid collection, or midline shift. There is patchy small vessel disease in the centra semiovale bilaterally. Elsewhere gray-white compartments appear normal. There is no evident acute infarct. Vascular: There is no hyperdense vessel.  There are foci of calcification in the carotid siphon regions bilaterally. Skull: The bony calvarium appears intact. There is residua of a previous small left occipital scalp hematoma, stable from prior study. Sinuses/Orbits: There is mucosal thickening in several ethmoid air cells bilaterally. There is slight mucosal thickening along the anterior wall of the right sphenoid sinus. Other visualized paranasal sinuses are clear. There is leftward deviation of the nasal septum. Visualized orbits appear unremarkable bilaterally. Other: Mastoid air cells are clear. IMPRESSION: Stable atrophy with patchy periventricular small vessel disease. No intracranial mass, hemorrhage, or extra-axial fluid collection. No acute infarct. There are foci of arterial vascular calcification bilaterally. There is paranasal sinus disease, primarily in several ethmoid air cells. There is leftward deviation of the nasal septum. Electronically Signed   By: Bretta Bang III M.D.   On: 05/15/2016 13:53   Dg Chest Port 1 View  Result Date: 05/15/2016 CLINICAL DATA:  83 year old male with altered mental status for 2 days, weakness, confusion, bradycardia. EXAM: PORTABLE CHEST 1 VIEW COMPARISON:  02/29/2016. FINDINGS: Portable AP semi upright view at 1250 hours. Pacer or resuscitation pads project over the left chest. Mildly lower lung volumes. Stable cardiac size and mediastinal contours. Calcified aortic atherosclerosis. Visualized  tracheal air column is within normal limits. Allowing for portable technique the lungs are clear. No pneumothorax or pleural effusion identified. IMPRESSION: No acute cardiopulmonary abnormality.  Mildly lower lung volumes. Calcified aortic atherosclerosis. Electronically Signed   By: Odessa Fleming M.D.   On: 05/15/2016 13:09        ASSESSMENT AND PLAN:      Active Problems:   * No active hospital problems. *   Bradycardia: The patient has a history of bradycardia with first degree AV block. He saw Ronie Spies, PA-C  in the office 08/03/2015 for his bradycardia The plan at this time was per Dr. Katrinka Blazing, that given his advanced age and progressive dementia he would continue to observe as he was asymptomatic. Symptoms to watch out for were inappropriate falling asleep, dizziness, syncope. No metabolic abnormalities to be causing the bradycardia. He is not on any AV nodal blocking agents. -- It was found in the primary care doctors notes on routine vital sign check.  The patient has been weak recently and this could be due to the bradycardia. I think, however, given his failure to thrive, worsening dementia, not eating that he is a poor candidate for a pacemaker.  Will discuss with Dr. Ladona Ridgel, if he does feel like the patient is a candidate for possible pacemaker then the ER physician plans to admit patient to palliative care team. If he feels the patient is not a good candidate the plan is to go home with hospice care.  SignedDorthula Matas, PA-C 05/15/2016   EP attending  Patient seen and examined. I concur with the findings as noted above by Ellin Saba PA-C. The patient is a 83 year old pleasantly demented man with a history of A-V conduction system disease, probable aortic stenosis, and fairly severe dementia. He was brought in today after he was noted to be bradycardic. Evaluation has demonstrated episodes of high-grade AV block with underlying left bundle branch block. The patient is  asymptomatic. 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 On physical exam he is a pleasant elderly appearing demented man but is otherwise in no acute distress. Cardiovascular exam revealed an irregular bradycardia with a grade  2/6 systolic murmur and a reduction in the second heart sound consistent with severe aortic stenosis. Lungs reveal scattered rales in the bases. There is no increased work of breathing. The abdominal exam was soft and nontender. Extremities demonstrated 2+ peripheral edema. Telemetry demonstrated sinus rhythm with intermittent AV block. EKG demonstrates sinus rhythm with intermittent AV block and left bundle branch block. Assessment and plan 1. Profound dementia the patient has near end-stage disease and is not a candidate for any invasive procedures. He should be strongly considered for hospice care. 2. Chronic probable diastolic heart failure - he has not had a 2-D echo but based on his current problem would not recommend 1. I would suggest oral Lasix for palliation. 3. Aortic stenosis - the patient is not a surgical candidate. I am certain his aortic stenosis is contributing to his heart failure. Would recommend gentle diuresis. I would not suggest a 2-D echo as it will not change our management. 4. High-grade heart block - the patient has fairly marked conduction system disease with left bundle branch block, and Mobitz 2 second-degree AV block. Because of his advanced dementia, he is not a candidate for permanent pacemaker insertion. I would suggest not placing the patient on telemetry. Comfort care is appropriate.  Lewayne Bunting, M.D. 4:49 PM

## 2016-05-15 NOTE — ED Provider Notes (Addendum)
[facility]-EMERGENCY DEPT Provider Note   CSN: [id number] Arrival date & time: 05/15/16  1212     History   Chief Complaint Chief Complaint  Patient presents with  . Altered Mental Status    HPI Troy Frankowski Sr. is a 83 y.o. male. CC:  Weakness  HPI: This is a 83 year old male. History of moderate dementia. He lives at home. As recently as 2 weeks ago he was still ambulatory at home. His family are his primary caregivers. He has home health aide for a few hours 3 days per week.  He has a history of first-degree AV block and bradycardia. Typical heart rates are in the 50s.  He presents today after a fall at home last night. Wife states that he simply just slid down to the ground. He did not have apparent injuries or complaints. He continues to be weak. He cannot stand without being lightheaded or near syncopal. Has been eating poorly. Confusion at baseline. Wife feels as though this is been "the same".  Saw cardiologist in the past with a slow heart rate. There was no mention of pacemaker per the family.  Patient arrives alone. Is able to speak with his wife and son via phone.   Past Medical History:  Diagnosis Date  . 1st degree AV block   . Abnormal electrocardiogram   . Alzheimer disease   . Bradycardia   . Dementia   . Depression   . Generalized anxiety disorder   . Hematuria   . Hyperlipidemia   . Left bundle branch block   . Polyuria     There are no active problems to display for this patient.   Past Surgical History:  Procedure Laterality Date  . NONE         Home Medications    Prior to Admission medications   Medication Sig Start Date End Date Taking? Authorizing Provider  aspirin EC 81 MG tablet Take 81 mg by mouth daily.    Historical Provider, MD  escitalopram (LEXAPRO) 20 MG tablet Take 10 mg by mouth daily.    Historical Provider, MD  memantine (NAMENDA) 10 MG tablet Take 10 mg by mouth 2 (two) times daily.    Historical Provider, MD  tamsulosin  (FLOMAX) 0.4 MG CAPS capsule Take 0.4 mg by mouth daily.    Historical Provider, MD    Family History Family History  Problem Relation Age of Onset  . Dementia Mother   . Alcoholism    . High Cholesterol      Social History Social History  Substance Use Topics  . Smoking status: Former Smoker    Quit date: 05/10/2014  . Smokeless tobacco: Never Used  . Alcohol use 0.0 oz/week     Comment: now 1.5 drinks daily     Allergies   Patient has no known allergies.   Review of Systems Review of Systems  Unable to perform ROS: Mental status change     Physical Exam Updated Vital Signs BP (!) 157/82   Pulse (!) 51   Temp 97.4 F (36.3 C) (Oral)   Resp (!) 21   SpO2 96%   Physical Exam  Constitutional: No distress.  Elderly appearing. Able to answer simple questions such as name and yes no questions.  HENT:  Head: Normocephalic.  Eyes: Conjunctivae are normal. Pupils are equal, round, and reactive to light. No scleral icterus.  Neck: Normal range of motion. Neck supple. No thyromegaly present.  Cardiovascular: Exam reveals no gallop and no friction  rub.   No murmur heard. Sinus bradycardia. Rate varies between 25 and 45 in the emergency room. Typically 35  Pulmonary/Chest: Effort normal and breath sounds normal. No respiratory distress. He has no wheezes. He has no rales.  Abdominal: Soft. Bowel sounds are normal. He exhibits no distension. There is no tenderness. There is no rebound.  Musculoskeletal: Normal range of motion.  Neurological: He is alert.  Skin: Skin is warm and dry. No rash noted.  Psychiatric: He has a normal mood and affect. His behavior is normal.     ED Treatments / Results  Labs (all labs ordered are listed, but only abnormal results are displayed) Labs Reviewed  CBC WITH DIFFERENTIAL/PLATELET - Abnormal; Notable for the following:       Result Value   RBC 3.40 (*)    Hemoglobin 10.8 (*)    HCT 33.0 (*)    All other components within normal  limits  BASIC METABOLIC PANEL - Abnormal; Notable for the following:    Calcium 8.8 (*)    All other components within normal limits  URINALYSIS, ROUTINE W REFLEX MICROSCOPIC - Abnormal; Notable for the following:    Hgb urine dipstick LARGE (*)    All other components within normal limits  MAGNESIUM  I-STAT TROPOININ, ED    EKG  EKG Interpretation  Date/Time:  Wednesday May 15 2016 12:13:19 EDT Ventricular Rate:  68 PR Interval:    QRS Duration: 150 QT Interval:  367 QTC Calculation: 332 R Axis:   68 Text Interpretation:  Sinus rhythm with first degree AVB, Marked Bradycardia  PACs, and PVCs Paired ventricular premature complexes IVCD, consider atypical LBBB Reconfirmed by Fayrene Fearing  MD, Miloh Alcocer ([id number]) on 05/15/2016 12:51:04 PM       Radiology Ct Head Wo Contrast  Result Date: 05/15/2016 CLINICAL DATA:  Lethargy with altered mental status for several days. Confusion EXAM: CT HEAD WITHOUT CONTRAST TECHNIQUE: Contiguous axial images were obtained from the base of the skull through the vertex without intravenous contrast. COMPARISON:  February 29, 2016 FINDINGS: Brain: Moderate generalized atrophy is stable. There is no intracranial mass, hemorrhage, extra-axial fluid collection, or midline shift. There is patchy small vessel disease in the centra semiovale bilaterally. Elsewhere gray-white compartments appear normal. There is no evident acute infarct. Vascular: There is no hyperdense vessel. There are foci of calcification in the carotid siphon regions bilaterally. Skull: The bony calvarium appears intact. There is residua of a previous small left occipital scalp hematoma, stable from prior study. Sinuses/Orbits: There is mucosal thickening in several ethmoid air cells bilaterally. There is slight mucosal thickening along the anterior wall of the right sphenoid sinus. Other visualized paranasal sinuses are clear. There is leftward deviation of the nasal septum. Visualized orbits appear  unremarkable bilaterally. Other: Mastoid air cells are clear. IMPRESSION: Stable atrophy with patchy periventricular small vessel disease. No intracranial mass, hemorrhage, or extra-axial fluid collection. No acute infarct. There are foci of arterial vascular calcification bilaterally. There is paranasal sinus disease, primarily in several ethmoid air cells. There is leftward deviation of the nasal septum. Electronically Signed   By: Bretta Bang III M.D.   On: 05/15/2016 13:53   Dg Chest Port 1 View  Result Date: 05/15/2016 CLINICAL DATA:  83 year old male with altered mental status for 2 days, weakness, confusion, bradycardia. EXAM: PORTABLE CHEST 1 VIEW COMPARISON:  02/29/2016. FINDINGS: Portable AP semi upright view at 1250 hours. Pacer or resuscitation pads project over the left chest. Mildly lower lung volumes. Stable cardiac size  and mediastinal contours. Calcified aortic atherosclerosis. Visualized tracheal air column is within normal limits. Allowing for portable technique the lungs are clear. No pneumothorax or pleural effusion identified. IMPRESSION: No acute cardiopulmonary abnormality.  Mildly lower lung volumes. Calcified aortic atherosclerosis. Electronically Signed   By: Odessa Fleming M.D.   On: 05/15/2016 13:09    Procedures Procedures (including critical care time)  Medications Ordered in ED Medications - No data to display   Initial Impression / Assessment and Plan / ED Course  I have reviewed the triage vital signs and the nursing notes.  Pertinent labs & imaging results that were available during my care of the patient were reviewed by me and considered in my medical decision making (see chart for details).   EKG and multiple rhythm strips show a sinus bradycardia with marked first-degree AV block. Probable sick sinus syndrome. Rate is quite variable here. Is not hypotensive or hypoxemic. Remainder of labs are reassuring.  I discussed the case with Dr. Sanjuana Kava of cardiology.  He states that he did put patient on the list for consultation. When asked about the possibility of pacemaker placement and our discussion that ensued, he stated that if the patient and family both wanted it that it would be a possibility for consideration. If family declines or would be deemed inappropriate, patient may need more intensive home care or skilled nursing or potentially hospice care, or palliative care.  Final Clinical Impressions(s) / ED Diagnoses   Final diagnoses:  Bradycardia   Patient's wife and son have arrived. I discussed his condition with him at length. If he should suddenly deteriorated heart rate, blood pressure, respiration status they are quite clear that they do not want him to be resuscitated if feel that this is consistent with his expressed desires to them. They however state that they would like to talk to a cardiologist regarding pacemaker. Discussed with him that he would likely not be a strong candidate. I also tried to set realistic expectations for any potential improvement he would receive from this. I placed a call to platelet of care. Discussed the case with care management regarding initiating plans for end-of-life care.   I also discussed the case with [facility] hospitalist Dr. Margot Ables. We agreed that discussion regarding inpatient treatment should ensue after cardiology, and paly of care input.  New Prescriptions New Prescriptions   No medications on file     Rolland Porter, MD 05/15/16 1540    Rolland Porter, MD 05/15/16 1608    Rolland Porter, MD 05/15/16 [phone]

## 2016-05-15 NOTE — ED Notes (Signed)
The pts family is talking about leaving as soon as a doctor comes in.  Pt alert

## 2016-05-15 NOTE — ED Notes (Signed)
Transported to CT on Monitor

## 2016-05-16 DIAGNOSIS — Z66 Do not resuscitate: Secondary | ICD-10-CM

## 2016-05-16 DIAGNOSIS — Z515 Encounter for palliative care: Secondary | ICD-10-CM

## 2016-05-16 DIAGNOSIS — I5031 Acute diastolic (congestive) heart failure: Secondary | ICD-10-CM

## 2016-05-16 DIAGNOSIS — F0281 Dementia in other diseases classified elsewhere with behavioral disturbance: Secondary | ICD-10-CM

## 2016-05-16 DIAGNOSIS — I350 Nonrheumatic aortic (valve) stenosis: Secondary | ICD-10-CM

## 2016-05-16 DIAGNOSIS — R627 Adult failure to thrive: Secondary | ICD-10-CM

## 2016-05-16 LAB — TSH: TSH: 3.375 u[IU]/mL (ref 0.350–4.500)

## 2016-05-16 LAB — BASIC METABOLIC PANEL
Anion gap: 10 (ref 5–15)
BUN: 16 mg/dL (ref 6–20)
CALCIUM: 9 mg/dL (ref 8.9–10.3)
CO2: 24 mmol/L (ref 22–32)
Chloride: 105 mmol/L (ref 101–111)
Creatinine, Ser: 1.02 mg/dL (ref 0.61–1.24)
GFR calc Af Amer: >60 mL/min (ref >60)
GLUCOSE: 82 mg/dL (ref 65–99)
Potassium: 3.5 mmol/L (ref 3.5–5.1)
Sodium: 139 mmol/L (ref 135–145)

## 2016-05-16 LAB — LIPID PANEL
CHOL/HDL RATIO: 3.2 ratio
Cholesterol: 145 mg/dL (ref 0–200)
HDL: 45 mg/dL (ref >40)
LDL Cholesterol: 84 mg/dL (ref 0–99)
Triglycerides: 78 mg/dL (ref <150)
VLDL: 16 mg/dL (ref 0–40)

## 2016-05-16 LAB — CBC
HCT: 34.8 % — ABNORMAL LOW (ref 39.0–52.0)
Hemoglobin: 11.3 g/dL — ABNORMAL LOW (ref 13.0–17.0)
MCH: 30.9 pg (ref 26.0–34.0)
MCHC: 32.5 g/dL (ref 30.0–36.0)
MCV: 95.1 fL (ref 78.0–100.0)
PLATELETS: 304 10*3/uL (ref 150–400)
RBC: 3.66 MIL/uL — ABNORMAL LOW (ref 4.22–5.81)
RDW: 14.3 % (ref 11.5–15.5)
WBC: 8.1 10*3/uL (ref 4.0–10.5)

## 2016-05-16 LAB — TROPONIN I
Troponin I: <0.03 ng/mL (ref <0.03)
Troponin I: <0.03 ng/mL (ref <0.03)

## 2016-05-16 LAB — T4, FREE: Free T4: 0.9 ng/dL (ref 0.61–1.12)

## 2016-05-16 LAB — PROTIME-INR
INR: 1.26
PROTHROMBIN TIME: 15.9 s — AB (ref 11.4–15.2)

## 2016-05-16 LAB — GLUCOSE, CAPILLARY: GLUCOSE-CAPILLARY: 93 mg/dL (ref 65–99)

## 2016-05-16 LAB — BRAIN NATRIURETIC PEPTIDE: B Natriuretic Peptide: 195.3 pg/mL — ABNORMAL HIGH (ref 0.0–100.0)

## 2016-05-16 MED ORDER — ESCITALOPRAM OXALATE 20 MG PO TABS
20.0000 mg | ORAL_TABLET | Freq: Every day | ORAL | Status: DC
  Administered 2016-05-16 – 2016-05-19 (×4): 20 mg via ORAL
  Filled 2016-05-16 (×4): qty 1

## 2016-05-16 MED ORDER — FINASTERIDE 5 MG PO TABS
5.0000 mg | ORAL_TABLET | Freq: Every evening | ORAL | Status: DC
  Administered 2016-05-16 – 2016-05-18 (×3): 5 mg via ORAL
  Filled 2016-05-16 (×4): qty 1

## 2016-05-16 MED ORDER — MORPHINE SULFATE (CONCENTRATE) 10 MG/0.5ML PO SOLN
5.0000 mg | ORAL | Status: DC | PRN
  Administered 2016-05-16 – 2016-05-17 (×2): 5 mg via ORAL
  Filled 2016-05-16 (×2): qty 0.5

## 2016-05-16 MED ORDER — HALOPERIDOL LACTATE 2 MG/ML PO CONC
2.0000 mg | Freq: Four times a day (QID) | ORAL | Status: DC | PRN
  Administered 2016-05-17: 2 mg via ORAL
  Filled 2016-05-16 (×2): qty 1

## 2016-05-16 NOTE — ED Notes (Signed)
Report given to rn on 6n rm 14

## 2016-05-16 NOTE — Consult Note (Signed)
[facility] Liaison  Received request from CSW RamosJeneya for [facility]. Chart reviewed and spoke with spouse by phone. Per [facility] MD, patient is hospice eligible but not quite ready for hospice home. Spoke with spouse about [facility] following at SNF until hospice can be initiated. Made CSW [place] aware. Please let us know if we can help in any other way.   Thank you,  Forrestine Himva Davis, LCSW [phone]

## 2016-05-16 NOTE — Progress Notes (Signed)
Received patient from ED.  Patient alert and oriented to self only.  VS stable with brady PR, O2Sat at 100% on RA and denies any pain.  Bed alarm on and patient placed on room with camera.  Will closely monitor.

## 2016-05-16 NOTE — Consult Note (Signed)
Consultation Note Date: 05/16/2016   Patient Name: Troy OlpWilliam Mcatee Sr.  DOB: [birth year]  MRN: [id number]  Age / Sex: 83 y.o., male  PCP: Lewis MoccasinElizabeth R Dewey, MD Referring Physician: Maretta BeesShanker M Ghimire, MD  Reason for Consultation: Establishing goals of care and Psychosocial/spiritual support  HPI/Patient Profile: 83 y.o. male  admitted on 05/15/2016 with past medical history significant of left bundle blockage, dementia, bradycardia, first-degree AV block, hyperlipidemia, depression, BPH, who presents with altered mental status and bradycardia. Per records in ED patient was found to have severe bradycardia with heart rate down to 25-30s. EKG in ED showed sinus rhythm with first degree AVB, marked bradycardia, PAC's and PVC's.   Per cardiology not a candidate for invasive procedures, and they recommend hospice for EOL care.  Continued physical, functional and cognitive decline over the past year per his wife and it is getting "impossible" to care for home at home.  Family is face with advanced directive decision and anticipatory care needs   Clinical Assessment and Goals of Care:  This NP Lorinda CreedMary Larach reviewed medical records, received report from team, assessed the patient and then meet at the patient's bedside and then spoke by phone with his wife to discuss diagnosis, prognosis, GOC, EOL wishes disposition and options.  I also spoke with son regarding GOCs and options  A detailed discussion was had today regarding advanced directives.  Concepts specific to code status, artifical feeding and hydration, continued IV antibiotics and rehospitalization was had.  The difference between a aggressive medical intervention path  and a palliative comfort care path for this patient at this time was had.  Values and goals of care important to patient and family were attempted to be elicited.  Concept of Hospice and Palliative  Care were discussed  Natural trajectory and expectations at EOL were discussed.  Questions and concerns addressed.   Family encouraged to call with questions or concerns.  PMT will continue to support holistically.   SUMMARY OF RECOMMENDATIONS    Code Status/Advance Care Planning:  DNR   Symptom Management:   Morphine and Haldol per MAR  Palliative Prophylaxis:   Aspiration, Bowel Regimen, Delirium Protocol, Frequent Pain Assessment and Oral Care  Additional Recommendations (Limitations, Scope, Preferences):  Full Comfort Care  Psycho-social/Spiritual:   Desire for further Chaplaincy support:no  Additional Recommendations: Education on Hospice  Prognosis:   Difficult to tell, he may decline rapidly but could be weeks.  Prognosis will depend on what his intake is, will write for liquids as tolerated to start.  Discussed with Dr Jerral RalphGhimire  Discharge Planning:  Family is hopeful for residential hospice  They are on board for full comfort path understanding no further diagnostics, artifical hydration and minimization of medications; however I shared with them that hospice facility eligibility is tight reflected in an expected prognosis of less than 2 weeks.    I worry that they may need to contemplate a plan B for discahrge    Hospice facility      Primary Diagnoses: Present on Admission: .  Hyperlipidemia . Depression . Bradycardia . Alzheimer disease . Acute encephalopathy . Elevated blood pressure reading   I have reviewed the medical record, interviewed the patient and family, and examined the patient. The following aspects are pertinent.  Past Medical History:  Diagnosis Date  . 1st degree AV block   . Abnormal electrocardiogram   . Alzheimer disease   . Bradycardia   . Dementia   . Depression   . Generalized anxiety disorder   . Hematuria   . Hyperlipidemia   . Left bundle branch block   . Polyuria    Social History   Social History  . Marital  status: Married    Spouse name: LUETTA  . Number of children: 8  . Years of education: 59   Occupational History  . RETIRED    Social History Main Topics  . Smoking status: Former Smoker    Quit date: 05/10/2014  . Smokeless tobacco: Never Used  . Alcohol use 0.0 oz/week     Comment: now 1.5 drinks daily  . Drug use: No  . Sexual activity: Not Asked   Other Topics Concern  . None   Social History Narrative  . None   Family History  Problem Relation Age of Onset  . Dementia Mother   . Alcoholism    . High Cholesterol     Scheduled Meds: . aspirin  150 mg Rectal Daily  . enoxaparin (LOVENOX) injection  40 mg Subcutaneous Daily  . furosemide  20 mg Intravenous Daily   Continuous Infusions: PRN Meds:.acetaminophen **OR** acetaminophen, ondansetron **OR** ondansetron (ZOFRAN) IV Medications Prior to Admission:  Prior to Admission medications   Medication Sig Start Date End Date Taking? Authorizing Provider  aspirin EC 81 MG tablet Take 81 mg by mouth daily.   Yes Historical Provider, MD  Cranberry 125 MG TABS Take 125 mg by mouth every morning.   Yes Historical Provider, MD  escitalopram (LEXAPRO) 20 MG tablet Take 20 mg by mouth daily.    Yes Historical Provider, MD  finasteride (PROSCAR) 5 MG tablet Take 5 mg by mouth every evening. 03/28/16  Yes Historical Provider, MD  memantine (NAMENDA) 10 MG tablet Take 10 mg by mouth 2 (two) times daily.   Yes Historical Provider, MD  oxybutynin (DITROPAN) 5 MG tablet Take 5 mg by mouth 2 (two) times daily. 04/30/16  Yes Historical Provider, MD  polyethylene glycol powder (GLYCOLAX/MIRALAX) powder Take 8.5-17 g by mouth at bedtime as needed for mild constipation.    Yes Historical Provider, MD   No Known Allergies Review of Systems  Unable to perform ROS: Dementia    Physical Exam  Constitutional:  -frail, elderly resting quietly at the present.  Sitter at bedside  Cardiovascular: Bradycardia present.   Pulmonary/Chest: He has  decreased breath sounds in the right lower field and the left lower field.  -shallow breathing pattern  Skin: Skin is warm and dry.    Vital Signs: BP (!) 169/64 (BP Location: Right Arm)   Pulse (!) 57   Temp 98 F (36.7 C) (Oral)   Resp (!) 24   Wt 82.5 kg (181 lb 14.1 oz)   SpO2 100%   BMI 25.37 kg/m  Pain Assessment: Faces       SpO2: SpO2: 100 % O2 Device:SpO2: 100 % O2 Flow Rate: .   IO: Intake/output summary:  Intake/Output Summary (Last 24 hours) at 05/16/16 1215 Last data filed at 05/16/16 1026  Gross per 24 hour  Intake  0 ml  Output              800 ml  Net             -800 ml    LBM:   Baseline Weight: Weight: 82.5 kg (181 lb 14.1 oz) Most recent weight: Weight: 82.5 kg (181 lb 14.1 oz)     Palliative Assessment/Data: 30 % at best   Discussed with SE and CMRN and Dr Jerral Ralph  Time In: 1200 Time Out: 1315 Time Total: 75 min Greater than 50%  of this time was spent counseling and coordinating care related to the above assessment and plan.  Signed by: Lorinda Creed, NP   Please contact Palliative Medicine Team phone at [phone] for questions and concerns.  For individual provider: See Loretha Stapler

## 2016-05-16 NOTE — Clinical Social Work Note (Signed)
CSW consulted for SNF placement and residential hospice. CSW spoke with pt wife-Luetta Margo AyeHall [phone]) via phone. Wife spoke with Palliative NP Corrie DandyMary earlier today and is in agreement with [facility]. CSW made referral [facility]. Per Carley HammedEva, pt not [facility] at this [facility], but is [facility] following at a SNF. CSW will speak with family about SNF. CSW continuing to follow for d/c needs.   Corlis HoveJeneya Marsh Heckler, LCSWA, LCASA  Clinical Social Work [phone]

## 2016-05-16 NOTE — Progress Notes (Signed)
PROGRESS NOTE        PATIENT DETAILS Name: Troy Hogg Sr. Age: 83 y.o. Sex: male Date of Birth: November 08, 1922 Admit Date: 05/15/2016 Admitting Physician Lorretta Harp, MD WUJ:WJXBJ,YNWGNFAOZ, MD  Brief Narrative: Patient is a 83 y.o. male with advanced dementia presented to the hospital with symptomatic bradycardia, found to have high degree AV block, seen by cardiology-and deemed not a candidate for pacemaker implantation. Subsequently admitted for further evaluation and treatment. See below for further details  Subjective: Lying comfortably in bed-not able to answer any questions appropriately at this time. Not able to tell me his name  Assessment/Plan: Symptomatic bradycardia due to high degree AV block: Avoid rate controlling agents-not a candidate for permanent pacemaker implantation (see cardiology note). Await palliative care input-will try to get in touch of family  Acute on chronic diastolic heart failure: Continue diuresis-not a candidate for aggressive care-no point in getting echocardiogram-will not change management.  Probable aortic stenosis: Likely contributing to above-not a candidate for further evaluation including echocardiogram.  Advanced dementia with delirium: Suspect close to his usual baseline-family does acknowledge slow worsening over the past several weeks/months. Do not think he has superimposed encephalopathy  Goals of care: Unfortunate 83 year old male with advanced dementia-now with high-grade AV block causing bradycardia, along with probable severe aortic stenosis causing heart failure. Discussed with the patient's son over the phone, he is aware of poor overall prognoses. He is aware that patient is not a candidate for further intervention including echocardiogram. Await input from palliative care to determine appropriate disposition. DNR in place.  DVT Prophylaxis: SCD's  Code Status: DNR   Family Communication: Son over the  phone  Disposition Plan: Remain inpatient-await hospice/palliative care eval  Antimicrobial agents: Anti-infectives    None     Procedures: None  CONSULTS:  cardiology  Time spent: 25- minutes-Greater than 50% of this time was spent in counseling, explanation of diagnosis, planning of further management, and coordination of care.  MEDICATIONS: Scheduled Meds: Continuous Infusions: PRN Meds:.acetaminophen **OR** acetaminophen, haloperidol, morphine CONCENTRATE, ondansetron **OR** ondansetron (ZOFRAN) IV   PHYSICAL EXAM: Vital signs: Vitals:   05/15/16 2230 05/15/16 2300 05/16/16 0000 05/16/16 0121  BP: (!) 179/63 (!) 172/62 (!) 161/71 (!) 169/64  Pulse:    (!) 57  Resp: (!) 37 (!) 26 16 (!) 24  Temp:    98 F (36.7 C)  TempSrc:    Oral  SpO2:    100%  Weight:    82.5 kg (181 lb 14.1 oz)   Filed Weights   05/16/16 0121  Weight: 82.5 kg (181 lb 14.1 oz)   Body mass index is 25.37 kg/m.   General appearance :Awake-very confused, not in any distress.  Eyes:, pupils equally reactive to light and accomodation,no scleral icterus. HEENT: Atraumatic and Normocephalic Neck: supple, no JVD. No cervical lymphadenopathy.  Resp:Good air entry bilaterally, no added sounds  CVS: S1 S2 regular, +syst murmur.  GI: Bowel sounds present, Non tender and not distended with no gaurding, rigidity or rebound.No organomegaly Extremities: B/L Lower Ext shows +edema, both legs are warm to touch Neurology:  speech clear,Non focal, sensation is grossly intact.  Musculoskeletal:No digital cyanosis Skin:No Rash, warm and dry Wounds:N/A  I have personally reviewed following labs and imaging studies  LABORATORY DATA: CBC:  Recent Labs Lab 05/15/16 1318 05/16/16 0508  WBC 6.3 8.1  NEUTROABS 4.1  --  HGB 10.8* 11.3*  HCT 33.0* 34.8*  MCV 97.1 95.1  PLT 272 304    Basic Metabolic Panel:  Recent Labs Lab 05/15/16 1318 05/16/16 0508  NA 142 139  K 3.9 3.5  CL 109 105    CO2 28 24  GLUCOSE 99 82  BUN 19 16  CREATININE 1.01 1.02  CALCIUM 8.8* 9.0  MG 2.2  --     GFR: CrCl cannot be calculated (Unknown ideal weight.).  Liver Function Tests: No results for input(s): AST, ALT, ALKPHOS, BILITOT, PROT, ALBUMIN in the last 168 hours. No results for input(s): LIPASE, AMYLASE in the last 168 hours. No results for input(s): AMMONIA in the last 168 hours.  Coagulation Profile:  Recent Labs Lab 05/16/16 0508  INR 1.26    Cardiac Enzymes:  Recent Labs Lab 05/16/16 0007 05/16/16 0508  TROPONINI <0.03 <0.03    BNP (last 3 results) No results for input(s): PROBNP in the last 8760 hours.  HbA1C: No results for input(s): HGBA1C in the last 72 hours.  CBG:  Recent Labs Lab 05/16/16 0733  GLUCAP 93    Lipid Profile:  Recent Labs  05/16/16 0508  CHOL 145  HDL 45  LDLCALC 84  TRIG 78  CHOLHDL 3.2    Thyroid Function Tests:  Recent Labs  05/16/16 0508  TSH 3.375  FREET4 0.90    Anemia Panel: No results for input(s): VITAMINB12, FOLATE, FERRITIN, TIBC, IRON, RETICCTPCT in the last 72 hours.  Urine analysis:    Component Value Date/Time   COLORURINE YELLOW 05/15/2016 1436   APPEARANCEUR CLEAR 05/15/2016 1436   LABSPEC 1.014 05/15/2016 1436   PHURINE 6.0 05/15/2016 1436   GLUCOSEU NEGATIVE 05/15/2016 1436   HGBUR LARGE (A) 05/15/2016 1436   BILIRUBINUR NEGATIVE 05/15/2016 1436   KETONESUR NEGATIVE 05/15/2016 1436   PROTEINUR NEGATIVE 05/15/2016 1436   NITRITE NEGATIVE 05/15/2016 1436   LEUKOCYTESUR NEGATIVE 05/15/2016 1436    Sepsis Labs: Lactic Acid, Venous    Component Value Date/Time   LATICACIDVEN 1.10 02/29/2016 1515    MICROBIOLOGY: No results found for this or any previous visit (from the past 240 hour(s)).  RADIOLOGY STUDIES/RESULTS: Ct Head Wo Contrast  Result Date: 05/15/2016 CLINICAL DATA:  Lethargy with altered mental status for several days. Confusion EXAM: CT HEAD WITHOUT CONTRAST TECHNIQUE:  Contiguous axial images were obtained from the base of the skull through the vertex without intravenous contrast. COMPARISON:  February 29, 2016 FINDINGS: Brain: Moderate generalized atrophy is stable. There is no intracranial mass, hemorrhage, extra-axial fluid collection, or midline shift. There is patchy small vessel disease in the centra semiovale bilaterally. Elsewhere gray-white compartments appear normal. There is no evident acute infarct. Vascular: There is no hyperdense vessel. There are foci of calcification in the carotid siphon regions bilaterally. Skull: The bony calvarium appears intact. There is residua of a previous small left occipital scalp hematoma, stable from prior study. Sinuses/Orbits: There is mucosal thickening in several ethmoid air cells bilaterally. There is slight mucosal thickening along the anterior wall of the right sphenoid sinus. Other visualized paranasal sinuses are clear. There is leftward deviation of the nasal septum. Visualized orbits appear unremarkable bilaterally. Other: Mastoid air cells are clear. IMPRESSION: Stable atrophy with patchy periventricular small vessel disease. No intracranial mass, hemorrhage, or extra-axial fluid collection. No acute infarct. There are foci of arterial vascular calcification bilaterally. There is paranasal sinus disease, primarily in several ethmoid air cells. There is leftward deviation of the nasal septum. Electronically Signed   By:  Bretta BangWilliam  Woodruff III M.D.   On: 05/15/2016 13:53   Dg Chest Port 1 View  Result Date: 05/15/2016 CLINICAL DATA:  83 year old male with altered mental status for 2 days, weakness, confusion, bradycardia. EXAM: PORTABLE CHEST 1 VIEW COMPARISON:  02/29/2016. FINDINGS: Portable AP semi upright view at 1250 hours. Pacer or resuscitation pads project over the left chest. Mildly lower lung volumes. Stable cardiac size and mediastinal contours. Calcified aortic atherosclerosis. Visualized tracheal air column is  within normal limits. Allowing for portable technique the lungs are clear. No pneumothorax or pleural effusion identified. IMPRESSION: No acute cardiopulmonary abnormality.  Mildly lower lung volumes. Calcified aortic atherosclerosis. Electronically Signed   By: Odessa FlemingH  Pae M.D.   On: 05/15/2016 13:09     LOS: 1 day   Jeoffrey MassedShanker Lyle Leisner, MD  [facility] Pager:[phone]  If 7PM-7AM, please contact night-coverage [email or web address] Password [email or web address] 05/16/2016, 12:52 PM

## 2016-05-16 NOTE — ED Notes (Signed)
The pt does not know the year the day of the week  He thinks its 1932.  He does not know the present.  When I asked him to show me his teeth he started taking his upper plate out

## 2016-05-16 NOTE — Progress Notes (Signed)
[facility]  Patient Status: Active (receiving services up to time of hospitalization)  [facility] is providing the following services: PT and ST  If patient discharges after hours, please call [phone].   Avie EchevariaKaren Nussbaum 05/16/2016, 8:17 AM

## 2016-05-17 DIAGNOSIS — G20 Parkinson's disease: Secondary | ICD-10-CM

## 2016-05-17 LAB — HEMOGLOBIN A1C
HEMOGLOBIN A1C: 5.3 % (ref 4.8–5.6)
MEAN PLASMA GLUCOSE: 105 mg/dL

## 2016-05-17 MED ORDER — FUROSEMIDE 20 MG PO TABS
20.0000 mg | ORAL_TABLET | Freq: Every day | ORAL | Status: DC
Start: 2016-05-17 — End: 2016-05-19
  Administered 2016-05-17 – 2016-05-19 (×3): 20 mg via ORAL
  Filled 2016-05-17 (×3): qty 1

## 2016-05-17 MED ORDER — FUROSEMIDE 20 MG PO TABS: 20.0000 mg | ORAL_TABLET | Freq: Every day | ORAL | Status: DC

## 2016-05-17 MED ORDER — MORPHINE SULFATE (CONCENTRATE) 10 MG/0.5ML PO SOLN: 5.0000 mg | ORAL | 0 refills | Status: AC | PRN

## 2016-05-17 NOTE — Progress Notes (Signed)
Patient restless and wants to get up.  Refused anything to offer, water, something to eat and not in pain based on faces pain scale.  Gave the activity apron to play with.  Will closely monitor.

## 2016-05-17 NOTE — Progress Notes (Signed)
PROGRESS NOTE        PATIENT DETAILS Name: Troy Crookshanks Sr. Age: 83 y.o. Sex: male Date of Birth: January 13, 1923 Admit Date: 05/15/2016 Admitting Physician Lorretta Harp, MD ZOX:[id number],[id number], MD  Brief Narrative: Patient is a 83 y.o. male with advanced dementia presented to the hospital with symptomatic bradycardia, found to have high degree AV block, seen by cardiology-and deemed not a candidate for pacemaker implantation. Subsequently admitted for further evaluation and treatment. See below for further details  Subjective: Lying comfortably in bed-very pleasantly confused  Assessment/Plan: Symptomatic bradycardia due to high degree AV block: Continue to avoid rate and drooling agents. Evaluated by EP on admission-not a candidate for permanent pacemaker implantation. Palliative care following-plans are to discharge to SNF with hospice follow-up.   Acute on chronic diastolic heart failure: Minimal lower extremity edema-cautiously continue with diuretics- not pursuing echo as it would not change management.   Probable aortic stenosis: Likely contributing to above-not a candidate for further evaluation including echocardiogram.  Advanced dementia with delirium: Suspect close to his usual baseline-family does acknowledge slow worsening over the past several weeks/months. Do not think he has superimposed encephalopathy  Goals of care: Unfortunate 83 year old male with advanced dementia-now with high-grade AV block causing bradycardia, along with probable severe aortic stenosis causing heart failure. Discussed with the patient's son over the phone, he is aware of poor overall prognoses. He is aware that patient is not a candidate for further intervention including echocardiogram. Evaluated by palliative care-not yet a candidate for residential hospice-plans are now to transition to SNF with hospice follow-up. Social worker following.  DVT Prophylaxis: SCD's  Code  Status: DNR   Family Communication: Son over the phone on 5/3 -none at bedside.   Disposition Plan: Remain inpatient-SNF hospice on discharge-when bed available   Antimicrobial agents: Anti-infectives    None     Procedures: None  CONSULTS:  cardiology  Time spent: 25- minutes-Greater than 50% of this time was spent in counseling, explanation of diagnosis, planning of further management, and coordination of care.  MEDICATIONS: Scheduled Meds: . escitalopram  20 mg Oral Daily  . finasteride  5 mg Oral QPM  . furosemide  20 mg Oral Daily   Continuous Infusions: PRN Meds:.acetaminophen **OR** acetaminophen, haloperidol, morphine CONCENTRATE, ondansetron **OR** ondansetron (ZOFRAN) IV   PHYSICAL EXAM: Vital signs: Vitals:   05/16/16 2230 05/17/16 0401 05/17/16 1257 05/17/16 1318  BP: (!) 137/58 (!) 155/52 (!) 141/79 (!) 176/84  Pulse: (!) 52 67 85 78  Resp: 18 18 18 18   Temp: 98.5 F (36.9 C) 98 F (36.7 C) 98.3 F (36.8 C) 98.7 F (37.1 C)  TempSrc: Axillary Axillary Oral Axillary  SpO2: 96% 94% 99% 98%  Weight:       Filed Weights   05/16/16 0121  Weight: 82.5 kg (181 lb 14.1 oz)   Body mass index is 25.37 kg/m.   General appearance :Awake but pleasantly confused Eyes:, pupils equally reactive to light and accomodation,no scleral icterus. HEENT: Atraumatic and Normocephalic Neck: supple, no JVD. Resp:Good air entry bilaterally CVS: S1 S2 regular,+ systolic murmur GI: Bowel sounds present, Non tender and not distended with no gaurding, rigidity or rebound. Extremities: B/L Lower Ext shows + edema, both legs are warm to touch Neurology: Non focal Skin:No Rash, warm and dry Wounds:N/A  I have personally reviewed following labs and imaging studies  LABORATORY  DATA: CBC:  Recent Labs Lab 05/15/16 1318 05/16/16 0508  WBC 6.3 8.1  NEUTROABS 4.1  --   HGB 10.8* 11.3*  HCT 33.0* 34.8*  MCV 97.1 95.1  PLT 272 304    Basic Metabolic  Panel:  Recent Labs Lab 05/15/16 1318 05/16/16 0508  NA 142 139  K 3.9 3.5  CL 109 105  CO2 28 24  GLUCOSE 99 82  BUN 19 16  CREATININE 1.01 1.02  CALCIUM 8.8* 9.0  MG 2.2  --     GFR: CrCl cannot be calculated (Unknown ideal weight.).  Liver Function Tests: No results for input(s): AST, ALT, ALKPHOS, BILITOT, PROT, ALBUMIN in the last 168 hours. No results for input(s): LIPASE, AMYLASE in the last 168 hours. No results for input(s): AMMONIA in the last 168 hours.  Coagulation Profile:  Recent Labs Lab 05/16/16 0508  INR 1.26    Cardiac Enzymes:  Recent Labs Lab 05/16/16 0007 05/16/16 0508  TROPONINI <0.03 <0.03    BNP (last 3 results) No results for input(s): PROBNP in the last 8760 hours.  HbA1C:  Recent Labs  05/16/16 0508  HGBA1C 5.3    CBG:  Recent Labs Lab 05/16/16 0733  GLUCAP 93    Lipid Profile:  Recent Labs  05/16/16 0508  CHOL 145  HDL 45  LDLCALC 84  TRIG 78  CHOLHDL 3.2    Thyroid Function Tests:  Recent Labs  05/16/16 0508  TSH 3.375  FREET4 0.90    Anemia Panel: No results for input(s): VITAMINB12, FOLATE, FERRITIN, TIBC, IRON, RETICCTPCT in the last 72 hours.  Urine analysis:    Component Value Date/Time   COLORURINE YELLOW 05/15/2016 1436   APPEARANCEUR CLEAR 05/15/2016 1436   LABSPEC 1.014 05/15/2016 1436   PHURINE 6.0 05/15/2016 1436   GLUCOSEU NEGATIVE 05/15/2016 1436   HGBUR LARGE (A) 05/15/2016 1436   BILIRUBINUR NEGATIVE 05/15/2016 1436   KETONESUR NEGATIVE 05/15/2016 1436   PROTEINUR NEGATIVE 05/15/2016 1436   NITRITE NEGATIVE 05/15/2016 1436   LEUKOCYTESUR NEGATIVE 05/15/2016 1436    Sepsis Labs: Lactic Acid, Venous    Component Value Date/Time   LATICACIDVEN 1.10 02/29/2016 1515    MICROBIOLOGY: No results found for this or any previous visit (from the past 240 hour(s)).  RADIOLOGY STUDIES/RESULTS: Ct Head Wo Contrast  Result Date: 05/15/2016 CLINICAL DATA:  Lethargy with altered  mental status for several days. Confusion EXAM: CT HEAD WITHOUT CONTRAST TECHNIQUE: Contiguous axial images were obtained from the base of the skull through the vertex without intravenous contrast. COMPARISON:  February 29, 2016 FINDINGS: Brain: Moderate generalized atrophy is stable. There is no intracranial mass, hemorrhage, extra-axial fluid collection, or midline shift. There is patchy small vessel disease in the centra semiovale bilaterally. Elsewhere gray-white compartments appear normal. There is no evident acute infarct. Vascular: There is no hyperdense vessel. There are foci of calcification in the carotid siphon regions bilaterally. Skull: The bony calvarium appears intact. There is residua of a previous small left occipital scalp hematoma, stable from prior study. Sinuses/Orbits: There is mucosal thickening in several ethmoid air cells bilaterally. There is slight mucosal thickening along the anterior wall of the right sphenoid sinus. Other visualized paranasal sinuses are clear. There is leftward deviation of the nasal septum. Visualized orbits appear unremarkable bilaterally. Other: Mastoid air cells are clear. IMPRESSION: Stable atrophy with patchy periventricular small vessel disease. No intracranial mass, hemorrhage, or extra-axial fluid collection. No acute infarct. There are foci of arterial vascular calcification bilaterally. There is paranasal  sinus disease, primarily in several ethmoid air cells. There is leftward deviation of the nasal septum. Electronically Signed   By: Bretta BangWilliam  Woodruff III M.D.   On: 05/15/2016 13:53   Dg Chest Port 1 View  Result Date: 05/15/2016 CLINICAL DATA:  83 year old male with altered mental status for 2 days, weakness, confusion, bradycardia. EXAM: PORTABLE CHEST 1 VIEW COMPARISON:  02/29/2016. FINDINGS: Portable AP semi upright view at 1250 hours. Pacer or resuscitation pads project over the left chest. Mildly lower lung volumes. Stable cardiac size and  mediastinal contours. Calcified aortic atherosclerosis. Visualized tracheal air column is within normal limits. Allowing for portable technique the lungs are clear. No pneumothorax or pleural effusion identified. IMPRESSION: No acute cardiopulmonary abnormality.  Mildly lower lung volumes. Calcified aortic atherosclerosis. Electronically Signed   By: Odessa FlemingH  Bribiesca M.D.   On: 05/15/2016 13:09     LOS: 2 days   Jeoffrey MassedShanker Idamae Coccia, MD  [facility] Pager:[phone]  If 7PM-7AM, please contact night-coverage [email or web address] Password [email or web address] 05/17/2016, 2:41 PM

## 2016-05-17 NOTE — Consult Note (Signed)
[facility] CM Primary Care Navigator  05/17/2016  Troy Vasek Sr. [birth year] [id number]   Went to see patient at the bedside to identify possible discharge needs and met with wife Troy Williamson) and daughter in-law Troy Williamson). Patient was resting with eyes closed.  Patient with advanced dementia and was admitted for symptomatic bradycardia.  Per MD note, he was found to have a high degree AV block and per cardiology, was deemed not a candidate for pacemaker implantation.  Family does acknowledge that patient is slowly worsening over the past several months.  Wife endorsed [facility] as [facility] primary care provider.  RN reports that patient will transition to SNF (skilled nursing facility) with hospice follow-up. Wife and daughter in law reports that they are awaiting for bed availability for patient.  No further health management needed at this time.  For questions, please contact:  Dannielle Huh, BSN, RN- [facility] Primary Care Navigator  Telephone: [phone] [place]

## 2016-05-18 NOTE — Progress Notes (Signed)
Patient seen and examined-lying comfortably in bed Vitals: Blood pressure 161/50, temp 97.5, pulse 50  Chest-bilaterally clear anteriorly CVS-S1-S2 regular-systolic murmur Abdomen-soft nontender Extremity-trace edema  Impression/plan High-grade AV block Aortic stenosis Acute on chronic Diastolic CHF  Poor overall prognosis-awaiting SNF placement with hospice follow-up

## 2016-05-18 NOTE — Progress Notes (Signed)
Clinical Social Worker met patients spouse and son outside patients room. CSW spoke to son and wife about patients discharge options. Family stated they would like patient to discharge to SNF with hospice to follow but stated they are unable to pay for the copay's since insurance will only pay for hospice and not room/board for SNF. Family stated they are leaning more towards taking patient home with hospice to follow. Family stated they will look into private duty to help wife at home with patients care. CSW has reached out to [facility] to make them aware of family's decision and concern of taking patient home. CSW remain available for support.   Rhea Pink, MSW,  [facility]

## 2016-05-19 LAB — GLUCOSE, CAPILLARY: Glucose-Capillary: 113 mg/dL — ABNORMAL HIGH (ref 65–99)

## 2016-05-19 MED ORDER — HALOPERIDOL LACTATE 2 MG/ML PO CONC: 2.0000 mg | Freq: Four times a day (QID) | ORAL | 0 refills | Status: AC | PRN

## 2016-05-19 MED ORDER — FUROSEMIDE 20 MG PO TABS: 20.0000 mg | ORAL_TABLET | Freq: Every day | ORAL | 0 refills | Status: AC

## 2016-05-19 NOTE — Progress Notes (Signed)
[facility]

## 2016-05-19 NOTE — Care Management Note (Addendum)
Case Management Note  Patient Details  Name: [name] MRN: [id number] Date of Birth: [birth year]  Subjective/Objective:                   Bradycardia/dementia Action/Plan:discharge planning Expected Discharge Date:  05/17/16               Expected Discharge Plan:  Home w Home Health Services  In-House Referral:  Clinical Social Work, Hospice / Palliative Care  Discharge planning Services  CM Consult  Post Acute Care Choice:  Hospice Choice offered to:  Spouse [facility](Luetta Charlson  [phone])  DME Arranged:  Hospital bed, Overbed table DME Agency:  NA  HH Arranged:    HH Agency:    Status of Service:  Completed, signed off  If discussed at Long Length of Stay Meetings, dates discussed:    Additional Comments: [facility]  No other CM needs were communicated.  CM spoke with [name] for choice.  [name] distraught as she feels we've given her family "the runaround."  [facility]  [facility]  CM also received a call from son, [name] [phone] who states family needs a CNA in place.  This Cm has offered a Education officer, museumrivate Duty Agency List and [facility] will speak to KronenwetterDerrick concerning arrangements.  Pt will need a hospital bed and bed tray table. Pt will need ambulance transport home and CM will arrange once Bed is in place at home of pt.   [name], [name], RN 05/19/2016, 9:10 AM

## 2016-05-19 NOTE — Discharge Summary (Signed)
PATIENT DETAILS Name: Troy Puthoff Sr. Age: 83 y.o. Sex: male Date of Birth: [birth year] MRN: [id number]. Admitting Physician: Lorretta Harp, MD WUJ:[id number], Christell Constant, MD  Admit Date: 05/15/2016 Discharge date: 05/19/2016  Recommendations for Outpatient Follow-up:  1. Follow up with PCP in 1-2 weeks as needed 2. Being discharged home with hospice care. 3. Goals of care are mostly for comfort  Admitted From:  Home  Disposition: Home with home Hospice   Home Health: No  Equipment/Devices: None  Discharge Condition: Stable  CODE STATUS: DNR/ Comfort Care  Diet recommendation:  Regular   Brief Summary: See H&P, Labs, Consult and Test reports for all details in brief, Patient is a 83 y.o. male with advanced dementia presented to the hospital with symptomatic bradycardia, found to have high degree AV block, seen by cardiology-and deemed not a candidate for pacemaker implantation. Subsequently admitted for further evaluation and treatment. See below for further details  Brief Hospital Course: Symptomatic bradycardia due to high degree AV block: Avoid rate controlling agents-not a candidate for permanent pacemaker implantation per EP note/reevaluation. Evaluated by palliative care services-goals of care mostly for comfort, initially SNF with hospice was contemplated, but due to insurance issues being discharged home with hospice care. Case work evaluation has been completed- better other hospital bed and other equipments have been arranged to be delivered later today by case management. Spoke with son over the phone-family agreeable for home with hospice later today.  Acute on chronic diastolic heart failure: Continue low-dose Lasix -not a candidate for aggressive care-no point in getting echocardiogram-will not change management.  Probable aortic stenosis: Likely contributing to above-not a candidate for further evaluation including echocardiogram.  Advanced dementia with  delirium: Suspect close to his usual baseline-family does acknowledge slow worsening over the past several weeks/months. Do not think he has superimposed encephalopathy  Goals of care: Unfortunate 83 year old male with advanced dementia-now with high-grade AV block causing bradycardia, along with probable severe aortic stenosis causing heart failure. Discussed with patient's son over the phone-family is aware of very poor overall prognoses-family is aware that patient is not a candidate for pacemaker implantation or any sort of aggressive measures. Initially residential hospice or SNF with hospice was contemplated-but now being discharged home with hospice care. Goals are mostly for comfort and gentle medical treatment. DNR in place.  Procedures/Studies: None  Discharge Diagnoses:  Principal Problem:   Bradycardia Active Problems:   Hyperlipidemia   Depression   Alzheimer disease   Acute encephalopathy   Elevated blood pressure reading   Palliative care by specialist   DNR (do not resuscitate)   Adult failure to thrive   Discharge Instructions:  Activity:  As tolerated with Full fall precautions use walker/cane & assistance as needed  Discharge Instructions    Diet - low sodium heart healthy    Complete by:  As directed    Diet - low sodium heart healthy    Complete by:  As directed    Increase activity slowly    Complete by:  As directed    Increase activity slowly    Complete by:  As directed      Allergies as of 05/19/2016   No Known Allergies     Medication List    STOP taking these medications   memantine 10 MG tablet Commonly known as:  NAMENDA     TAKE these medications   aspirin EC 81 MG tablet Take 81 mg by mouth daily.   Cranberry 125 MG Tabs Take  125 mg by mouth every morning.   escitalopram 20 MG tablet Commonly known as:  LEXAPRO Take 20 mg by mouth daily.   finasteride 5 MG tablet Commonly known as:  PROSCAR Take 5 mg by mouth every  evening.   furosemide 20 MG tablet Commonly known as:  LASIX Take 1 tablet (20 mg total) by mouth daily.   haloperidol 2 MG/ML solution Commonly known as:  HALDOL Take 1 mL (2 mg total) by mouth every 6 (six) hours as needed for agitation.   morphine CONCENTRATE 10 MG/0.5ML Soln concentrated solution Take 0.25 mLs (5 mg total) by mouth every 2 (two) hours as needed for moderate pain or shortness of breath.   oxybutynin 5 MG tablet Commonly known as:  DITROPAN Take 5 mg by mouth 2 (two) times daily.   polyethylene glycol powder powder Commonly known as:  GLYCOLAX/MIRALAX Take 8.5-17 g by mouth at bedtime as needed for mild constipation.      Follow-up Information    [facility]   Specialty:  Hospice Services Why:  home hospice agency Contact information: [address] [place] [address] [phone]        Lewis Moccasin, MD. Schedule an appointment as soon as possible for a visit in 1 week(s).   Specialty:  Family Medicine Contact information: [address] [place] [address] [phone]          No Known Allergies  Consultations:   cardiology and Palliative care  Other Procedures/Studies: Ct Head Wo Contrast  Result Date: 05/15/2016 CLINICAL DATA:  Lethargy with altered mental status for several days. Confusion EXAM: CT HEAD WITHOUT CONTRAST TECHNIQUE: Contiguous axial images were obtained from the base of the skull through the vertex without intravenous contrast. COMPARISON:  February 29, 2016 FINDINGS: Brain: Moderate generalized atrophy is stable. There is no intracranial mass, hemorrhage, extra-axial fluid collection, or midline shift. There is patchy small vessel disease in the centra semiovale bilaterally. Elsewhere gray-white compartments appear normal. There is no evident acute infarct. Vascular: There is no hyperdense vessel. There are foci of calcification in the carotid siphon regions bilaterally. Skull:  The bony calvarium appears intact. There is residua of a previous small left occipital scalp hematoma, stable from prior study. Sinuses/Orbits: There is mucosal thickening in several ethmoid air cells bilaterally. There is slight mucosal thickening along the anterior wall of the right sphenoid sinus. Other visualized paranasal sinuses are clear. There is leftward deviation of the nasal septum. Visualized orbits appear unremarkable bilaterally. Other: Mastoid air cells are clear. IMPRESSION: Stable atrophy with patchy periventricular small vessel disease. No intracranial mass, hemorrhage, or extra-axial fluid collection. No acute infarct. There are foci of arterial vascular calcification bilaterally. There is paranasal sinus disease, primarily in several ethmoid air cells. There is leftward deviation of the nasal septum. Electronically Signed   By: Bretta Bang III M.D.   On: 05/15/2016 13:53   Dg Chest Port 1 View  Result Date: 05/15/2016 CLINICAL DATA:  83 year old male with altered mental status for 2 days, weakness, confusion, bradycardia. EXAM: PORTABLE CHEST 1 VIEW COMPARISON:  02/29/2016. FINDINGS: Portable AP semi upright view at 1250 hours. Pacer or resuscitation pads project over the left chest. Mildly lower lung volumes. Stable cardiac size and mediastinal contours. Calcified aortic atherosclerosis. Visualized tracheal air column is within normal limits. Allowing for portable technique the lungs are clear. No pneumothorax or pleural effusion identified. IMPRESSION: No acute cardiopulmonary abnormality.  Mildly lower lung  volumes. Calcified aortic atherosclerosis. Electronically Signed   By: Odessa Fleming M.D.   On: 05/15/2016 13:09      TODAY-DAY OF DISCHARGE:  Subjective:   Troy Williamson today has no headache,no chest abdominal pain,no new weakness tingling or numbness, feels much better wants to go home today.   Objective:   Blood pressure (!) 159/80, pulse 60, temperature 97.7 F (36.5  C), temperature source Oral, resp. rate 18, weight 82.5 kg (181 lb 14.1 oz), SpO2 100 %.  Intake/Output Summary (Last 24 hours) at 05/19/16 1458 Last data filed at 05/19/16 0919  Gross per 24 hour  Intake              540 ml  Output              300 ml  Net              240 ml   Filed Weights   05/16/16 0121  Weight: 82.5 kg (181 lb 14.1 oz)    Exam: Awake But very confused No new F.N deficits [place].AT,PERRAL Supple Neck,No JVD, No cervical lymphadenopathy appriciated.  Symmetrical Chest wall movement, Good air movement bilaterally, CTAB RRR,No Gallops,Rubs or new Murmurs, No Parasternal Heave +ve B.Sounds, Abd Soft, Non tender, No organomegaly appriciated, No rebound -guarding or rigidity. No Cyanosis, Clubbing or edema, No new Rash or bruise   PERTINENT RADIOLOGIC STUDIES: Ct Head Wo Contrast  Result Date: 05/15/2016 CLINICAL DATA:  Lethargy with altered mental status for several days. Confusion EXAM: CT HEAD WITHOUT CONTRAST TECHNIQUE: Contiguous axial images were obtained from the base of the skull through the vertex without intravenous contrast. COMPARISON:  February 29, 2016 FINDINGS: Brain: Moderate generalized atrophy is stable. There is no intracranial mass, hemorrhage, extra-axial fluid collection, or midline shift. There is patchy small vessel disease in the centra semiovale bilaterally. Elsewhere gray-white compartments appear normal. There is no evident acute infarct. Vascular: There is no hyperdense vessel. There are foci of calcification in the carotid siphon regions bilaterally. Skull: The bony calvarium appears intact. There is residua of a previous small left occipital scalp hematoma, stable from prior study. Sinuses/Orbits: There is mucosal thickening in several ethmoid air cells bilaterally. There is slight mucosal thickening along the anterior wall of the right sphenoid sinus. Other visualized paranasal sinuses are clear. There is leftward deviation of the nasal septum.  Visualized orbits appear unremarkable bilaterally. Other: Mastoid air cells are clear. IMPRESSION: Stable atrophy with patchy periventricular small vessel disease. No intracranial mass, hemorrhage, or extra-axial fluid collection. No acute infarct. There are foci of arterial vascular calcification bilaterally. There is paranasal sinus disease, primarily in several ethmoid air cells. There is leftward deviation of the nasal septum. Electronically Signed   By: Bretta Bang III M.D.   On: 05/15/2016 13:53   Dg Chest Port 1 View  Result Date: 05/15/2016 CLINICAL DATA:  83 year old male with altered mental status for 2 days, weakness, confusion, bradycardia. EXAM: PORTABLE CHEST 1 VIEW COMPARISON:  02/29/2016. FINDINGS: Portable AP semi upright view at 1250 hours. Pacer or resuscitation pads project over the left chest. Mildly lower lung volumes. Stable cardiac size and mediastinal contours. Calcified aortic atherosclerosis. Visualized tracheal air column is within normal limits. Allowing for portable technique the lungs are clear. No pneumothorax or pleural effusion identified. IMPRESSION: No acute cardiopulmonary abnormality.  Mildly lower lung volumes. Calcified aortic atherosclerosis. Electronically Signed   By: Odessa Fleming M.D.   On: 05/15/2016 13:09     PERTINENT LAB RESULTS:  CBC: No results for input(s): WBC, HGB, HCT, PLT in the last 72 hours. CMET CMP     Component Value Date/Time   NA 139 05/16/2016 0508   K 3.5 05/16/2016 0508   CL 105 05/16/2016 0508   CO2 24 05/16/2016 0508   GLUCOSE 82 05/16/2016 0508   BUN 16 05/16/2016 0508   CREATININE 1.02 05/16/2016 0508   CREATININE 1.00 08/03/2015 1550   CALCIUM 9.0 05/16/2016 0508   PROT 6.0 (L) 02/29/2016 1133   ALBUMIN 3.2 (L) 02/29/2016 1133   AST 53 (H) 02/29/2016 1133   ALT 25 02/29/2016 1133   ALKPHOS 62 02/29/2016 1133   BILITOT 1.2 02/29/2016 1133   GFRNONAA >60 05/16/2016 0508   GFRAA >60 05/16/2016 0508    GFR CrCl cannot  be calculated (Unknown ideal weight.). No results for input(s): LIPASE, AMYLASE in the last 72 hours. No results for input(s): CKTOTAL, CKMB, CKMBINDEX, TROPONINI in the last 72 hours. Invalid input(s): POCBNP No results for input(s): DDIMER in the last 72 hours. No results for input(s): HGBA1C in the last 72 hours. No results for input(s): CHOL, HDL, LDLCALC, TRIG, CHOLHDL, LDLDIRECT in the last 72 hours. No results for input(s): TSH, T4TOTAL, T3FREE, THYROIDAB in the last 72 hours.  Invalid input(s): FREET3 No results for input(s): VITAMINB12, FOLATE, FERRITIN, TIBC, IRON, RETICCTPCT in the last 72 hours. Coags: No results for input(s): INR in the last 72 hours.  Invalid input(s): PT Microbiology: No results found for this or any previous visit (from the past 240 hour(s)).  FURTHER DISCHARGE INSTRUCTIONS:  Get Medicines reviewed and adjusted: Please take all your medications with you for your next visit with your Primary MD  Laboratory/radiological data: Please request your Primary MD to go over all hospital tests and procedure/radiological results at the follow up, please ask your Primary MD to get all Hospital records sent to his/her office.  In some cases, they will be blood work, cultures and biopsy results pending at the time of your discharge. Please request that your primary care M.D. goes through all the records of your hospital data and follows up on these results.  Also Note the following: If you experience worsening of your admission symptoms, develop shortness of breath, life threatening emergency, suicidal or homicidal thoughts you must seek medical attention immediately by calling 911 or calling your MD immediately  if symptoms less severe.  You must read complete instructions/literature along with all the possible adverse reactions/side effects for all the Medicines you take and that have been prescribed to you. Take any new Medicines after you have completely  understood and accpet all the possible adverse reactions/side effects.   Do not drive when taking Pain medications or sleeping medications (Benzodaizepines)  Do not take more than prescribed Pain, Sleep and Anxiety Medications. It is not advisable to combine anxiety,sleep and pain medications without talking with your primary care practitioner  Special Instructions: If you have smoked or chewed Tobacco  in the last 2 yrs please stop smoking, stop any regular Alcohol  and or any Recreational drug use.  Wear Seat belts while driving.  Please note: You were cared for by a hospitalist during your hospital stay. Once you are discharged, your primary care physician will handle any further medical issues. Please note that NO REFILLS for any discharge medications will be authorized once you are discharged, as it is imperative that you return to your primary care physician (or establish a relationship with a primary care physician if you do  not have one) for your post hospital discharge needs so that they can reassess your need for medications and monitor your lab values.  Total Time spent coordinating discharge including counseling, education and face to face time equals  45 minutes.  SignedJeoffrey Massed 05/19/2016 2:58 PM

## 2016-05-19 NOTE — Progress Notes (Signed)
Son made aware of the discharge .

## 2016-05-21 DIAGNOSIS — R001 Bradycardia, unspecified: Secondary | ICD-10-CM | POA: Diagnosis not present

## 2016-05-21 DIAGNOSIS — I440 Atrioventricular block, first degree: Secondary | ICD-10-CM | POA: Diagnosis not present

## 2016-05-21 DIAGNOSIS — F0390 Unspecified dementia without behavioral disturbance: Secondary | ICD-10-CM | POA: Diagnosis not present

## 2016-05-21 DIAGNOSIS — G309 Alzheimer's disease, unspecified: Secondary | ICD-10-CM | POA: Diagnosis not present

## 2016-06-14 DEATH — deceased

## 2019-02-10 IMAGING — CT CT HEAD W/O CM
4 series · 16 of 47 positions shown, 18 images · non-contrast
Comparison: None.

CLINICAL DATA: Altered mental status, weakness

EXAM:
CT HEAD WITHOUT CONTRAST
TECHNIQUE: Contiguous axial images were obtained from the base of the skull
through the vertex without intravenous contrast.

[Series 2: head without · axial · non-contrast · 0.47mm/px · z∈[-64,+56]mm · 7 of 34 slices shown, 9 images]
[im 5/34  brain]
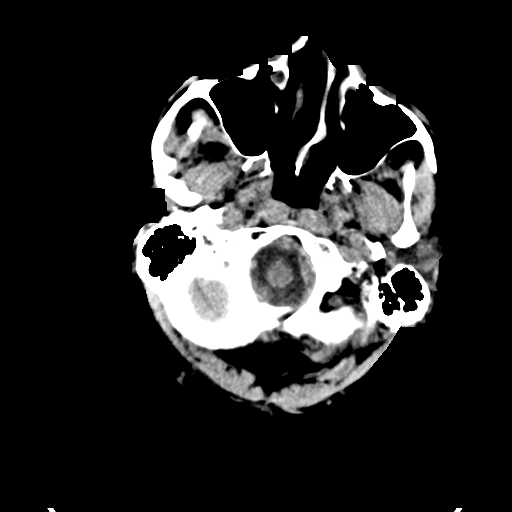
[im 5/34  bone]
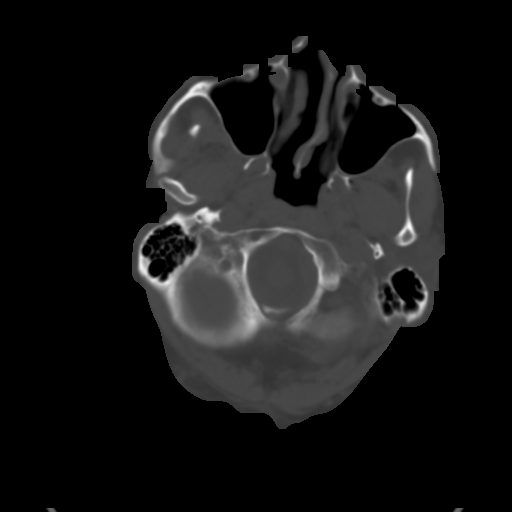
[im 9/34  brain]
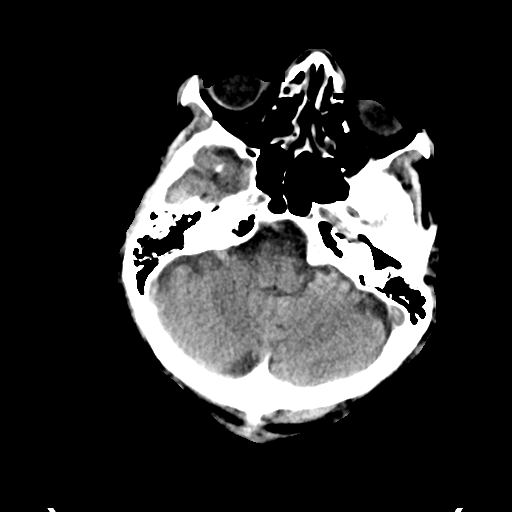
[im 13/34  brain]
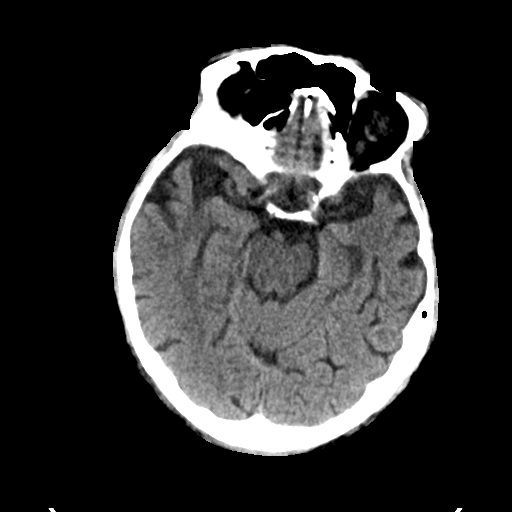
[im 17/34  brain]
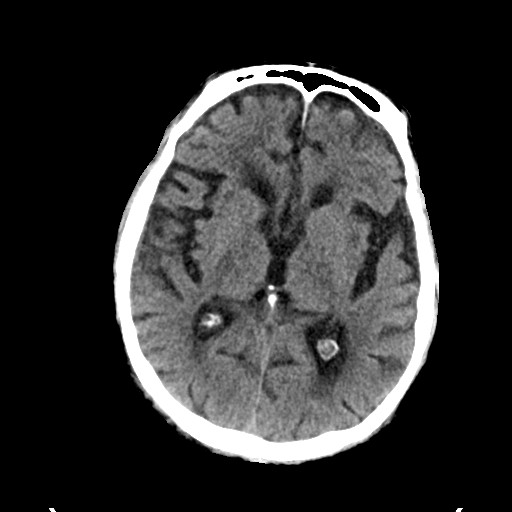
[im 21/34  brain]
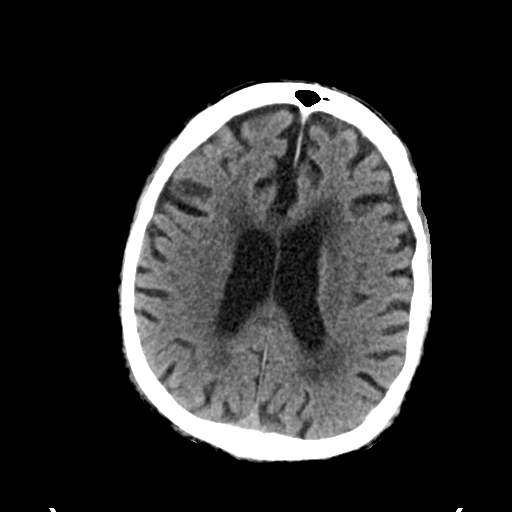
[im 21/34  bone]
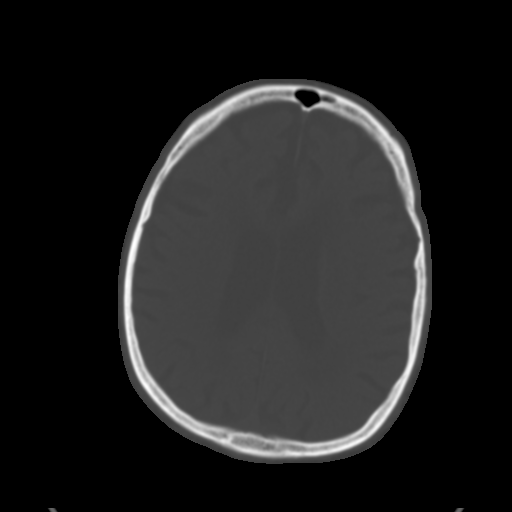
[im 25/34  brain]
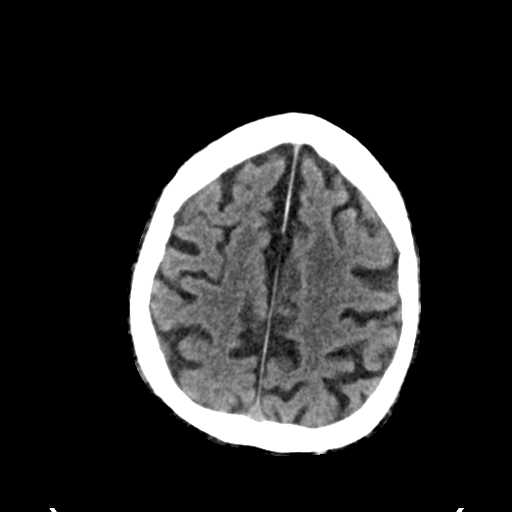
[im 29/34  brain]
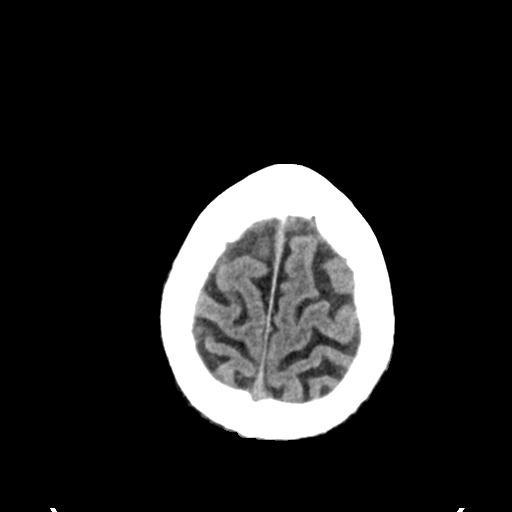

[Series 3: head bone · axial · 0.47mm/px · z∈[-68,-36]mm · 3 of 84 slices shown]
[im 9/84  bone]
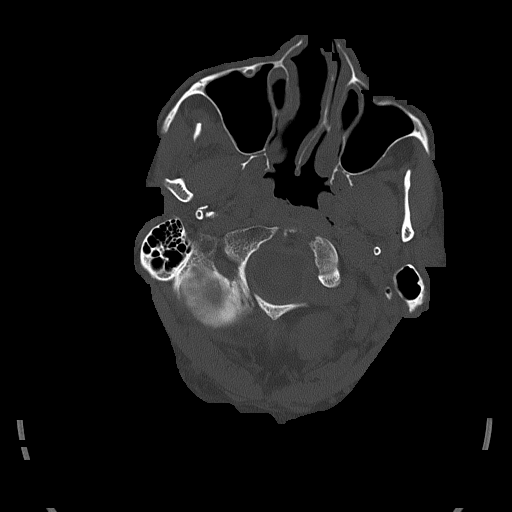
[im 17/84  bone]
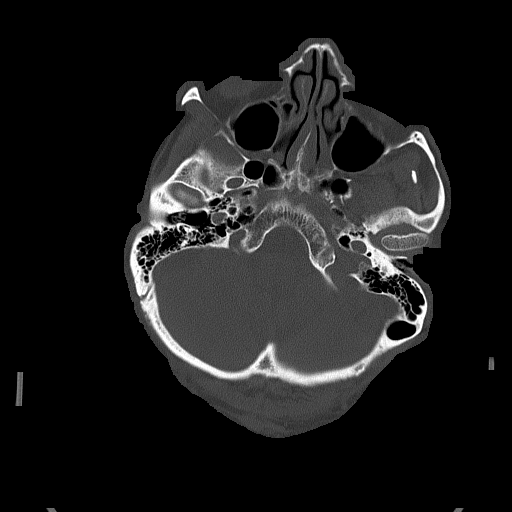
[im 25/84  bone]
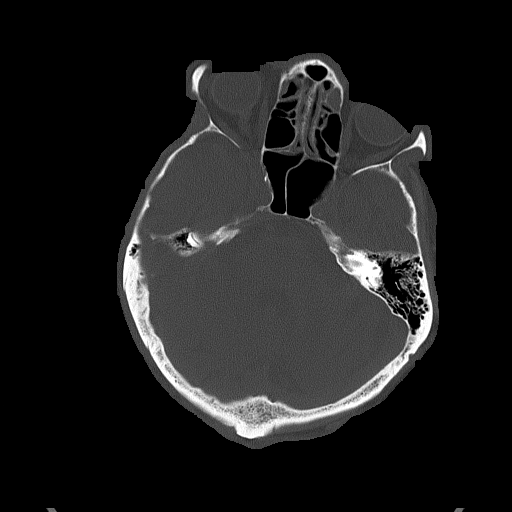

[Series 4: head without cor · coronal · non-contrast · 0.36mm/px · 3 of 71 slices shown]
[im 24/71  brain]
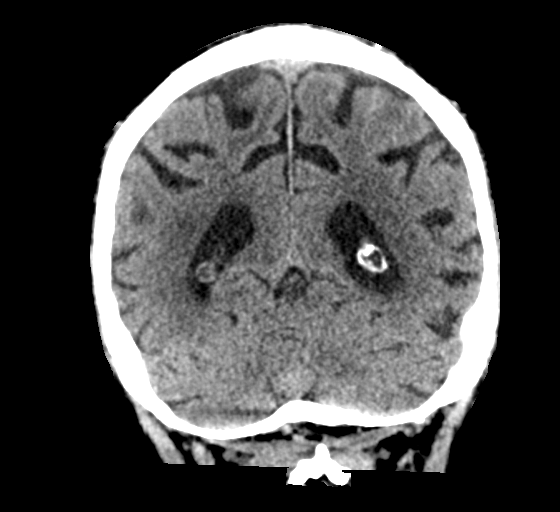
[im 32/71  brain]
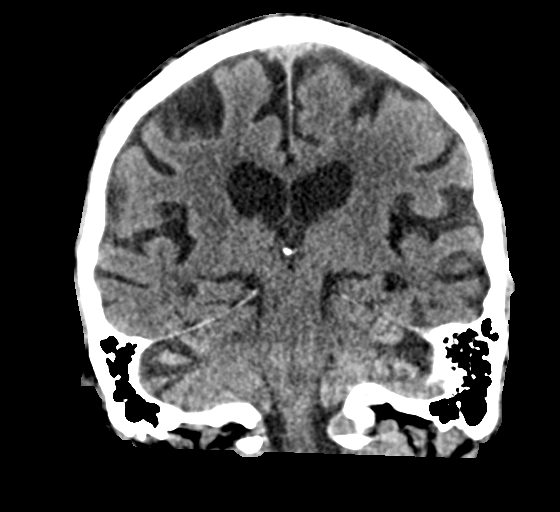
[im 39/71  brain]
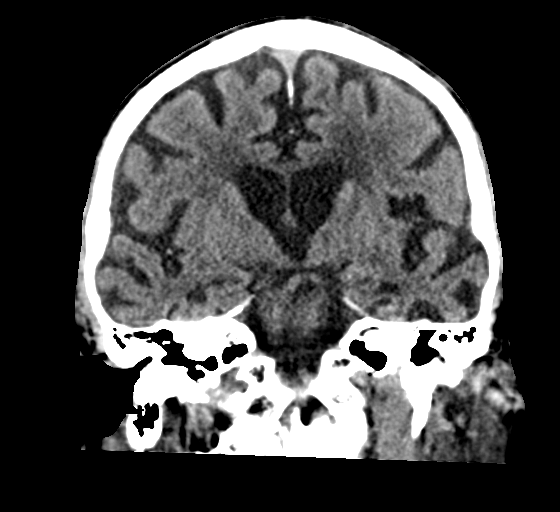

[Series 5: head without sag · sagittal · non-contrast · 0.36mm/px · 3 of 64 slices shown]
[im 22/64  brain]
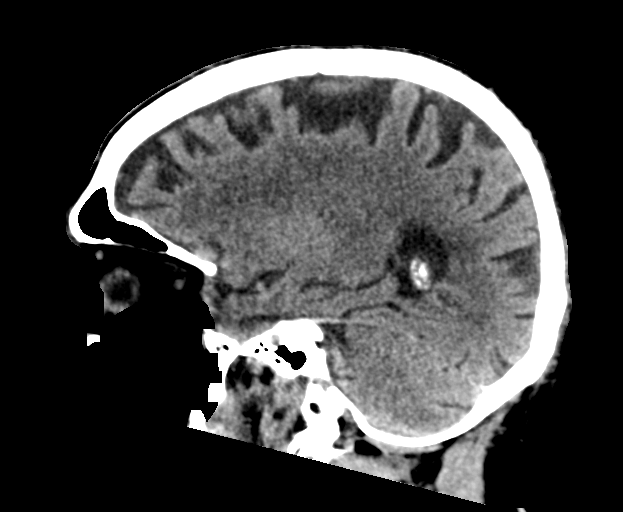
[im 32/64  brain]
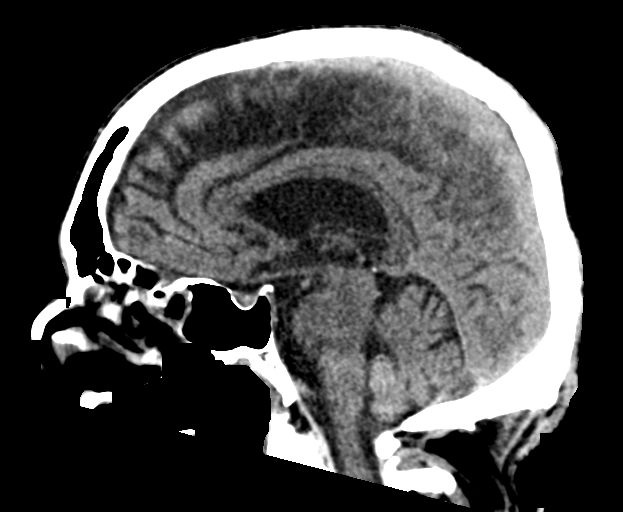
[im 43/64  brain]
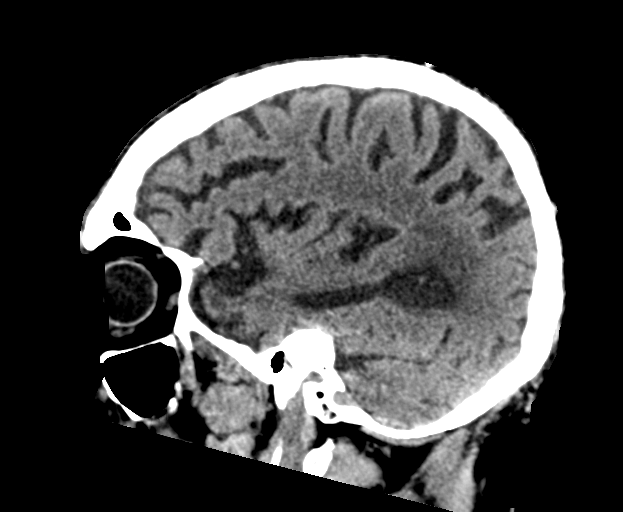

[16 of 47 positions shown; findings below may reference images not displayed]

FINDINGS: Brain: No intracranial hemorrhage, mass effect or midline shift.
Moderate cerebral atrophy. There is mild periventricular and patchy
subcortical white matter decreased attenuation probable due to
chronic small vessel ischemic changes. No definite acute cortical
infarction. No mass lesion is noted on this unenhanced scan

Vascular: Atherosclerotic calcifications of carotid siphon are
noted.

Skull: No skull fracture is noted.

Sinuses/Orbits: There is mucosal thickening with partial
opacification ethmoid air cells. No paranasal sinuses air-fluid
levels. The mastoid air cells are unremarkable. Mucosal thickening
also noted inferior aspect of the right maxillary sinus.

Other: None
IMPRESSION: No acute intracranial abnormality. Moderate cerebral atrophy. Mild
periventricular and patchy subcortical white matter decreased
attenuation probable due to chronic small vessel ischemic changes.
There is mild mucosal thickening with partial opacification
bilateral ethmoid air cells. Mucosal thickening right maxillary
sinus.
